# Patient Record
Sex: Male | Born: 1935 | Race: White | Hispanic: No | Smoking: Former smoker
Health system: Southern US, Community
[De-identification: ages and names within clinical notes are randomized; demographics above are authoritative.]

## PROBLEM LIST (undated history)

## (undated) DIAGNOSIS — Z950 Presence of cardiac pacemaker: Secondary | ICD-10-CM

## (undated) DIAGNOSIS — F329 Major depressive disorder, single episode, unspecified: Secondary | ICD-10-CM

## (undated) DIAGNOSIS — R55 Syncope and collapse: Secondary | ICD-10-CM

## (undated) DIAGNOSIS — I472 Ventricular tachycardia: Secondary | ICD-10-CM

## (undated) DIAGNOSIS — E785 Hyperlipidemia, unspecified: Secondary | ICD-10-CM

## (undated) DIAGNOSIS — I2510 Atherosclerotic heart disease of native coronary artery without angina pectoris: Secondary | ICD-10-CM

## (undated) DIAGNOSIS — J449 Chronic obstructive pulmonary disease, unspecified: Secondary | ICD-10-CM

## (undated) DIAGNOSIS — C679 Malignant neoplasm of bladder, unspecified: Secondary | ICD-10-CM

## (undated) DIAGNOSIS — G4700 Insomnia, unspecified: Secondary | ICD-10-CM

## (undated) HISTORY — PX: PACEMAKER INSERTION: SHX728

---

## 2018-03-04 ENCOUNTER — Emergency Department (HOSPITAL_COMMUNITY)
Admission: EM | Admit: 2018-03-04 | Discharge: 2018-03-04 | Disposition: A | Discharge status: Home / Self Care | Payer: Medicare Other | Attending: Emergency Medicine | Admitting: Emergency Medicine

## 2018-03-04 ENCOUNTER — Encounter (HOSPITAL_COMMUNITY): Payer: Self-pay | Admitting: Radiology

## 2018-03-04 ENCOUNTER — Emergency Department (HOSPITAL_COMMUNITY): Payer: Medicare Other

## 2018-03-04 ENCOUNTER — Other Ambulatory Visit: Payer: Self-pay

## 2018-03-04 DIAGNOSIS — J449 Chronic obstructive pulmonary disease, unspecified: Secondary | ICD-10-CM | POA: Diagnosis not present

## 2018-03-04 DIAGNOSIS — R55 Syncope and collapse: Secondary | ICD-10-CM

## 2018-03-04 DIAGNOSIS — S01112A Laceration without foreign body of left eyelid and periocular area, initial encounter: Secondary | ICD-10-CM

## 2018-03-04 DIAGNOSIS — R4182 Altered mental status, unspecified: Secondary | ICD-10-CM | POA: Diagnosis not present

## 2018-03-04 DIAGNOSIS — Z8551 Personal history of malignant neoplasm of bladder: Secondary | ICD-10-CM | POA: Diagnosis not present

## 2018-03-04 DIAGNOSIS — Z23 Encounter for immunization: Secondary | ICD-10-CM | POA: Insufficient documentation

## 2018-03-04 DIAGNOSIS — I472 Ventricular tachycardia, unspecified: Secondary | ICD-10-CM

## 2018-03-04 DIAGNOSIS — Y939 Activity, unspecified: Secondary | ICD-10-CM | POA: Insufficient documentation

## 2018-03-04 DIAGNOSIS — Y92129 Unspecified place in nursing home as the place of occurrence of the external cause: Secondary | ICD-10-CM | POA: Insufficient documentation

## 2018-03-04 DIAGNOSIS — S60222A Contusion of left hand, initial encounter: Secondary | ICD-10-CM | POA: Diagnosis not present

## 2018-03-04 DIAGNOSIS — S0990XA Unspecified injury of head, initial encounter: Secondary | ICD-10-CM

## 2018-03-04 DIAGNOSIS — Z950 Presence of cardiac pacemaker: Secondary | ICD-10-CM | POA: Diagnosis not present

## 2018-03-04 DIAGNOSIS — W19XXXA Unspecified fall, initial encounter: Secondary | ICD-10-CM

## 2018-03-04 DIAGNOSIS — Z79899 Other long term (current) drug therapy: Secondary | ICD-10-CM | POA: Diagnosis not present

## 2018-03-04 DIAGNOSIS — Y998 Other external cause status: Secondary | ICD-10-CM | POA: Insufficient documentation

## 2018-03-04 HISTORY — DX: Syncope and collapse: R55

## 2018-03-04 HISTORY — DX: Presence of cardiac pacemaker: Z95.0

## 2018-03-04 HISTORY — DX: Hyperlipidemia, unspecified: E78.5

## 2018-03-04 HISTORY — DX: Malignant neoplasm of bladder, unspecified: C67.9

## 2018-03-04 HISTORY — DX: Ventricular tachycardia: I47.2

## 2018-03-04 HISTORY — DX: Chronic obstructive pulmonary disease, unspecified: J44.9

## 2018-03-04 HISTORY — DX: Major depressive disorder, single episode, unspecified: F32.9

## 2018-03-04 HISTORY — DX: Ventricular tachycardia, unspecified: I47.20

## 2018-03-04 HISTORY — DX: Atherosclerotic heart disease of native coronary artery without angina pectoris: I25.10

## 2018-03-04 HISTORY — DX: Insomnia, unspecified: G47.00

## 2018-03-04 LAB — COMPREHENSIVE METABOLIC PANEL
ALT: 11 U/L — ABNORMAL LOW (ref 17–63)
AST: 25 U/L (ref 15–41)
Albumin: 3.3 g/dL — ABNORMAL LOW (ref 3.5–5.0)
Alkaline Phosphatase: 127 U/L — ABNORMAL HIGH (ref 38–126)
Anion gap: 7 (ref 5–15)
BUN: 13 mg/dL (ref 6–20)
CHLORIDE: 105 mmol/L (ref 101–111)
CO2: 24 mmol/L (ref 22–32)
CREATININE: 0.9 mg/dL (ref 0.61–1.24)
Calcium: 8.9 mg/dL (ref 8.9–10.3)
Glucose, Bld: 172 mg/dL — ABNORMAL HIGH (ref 65–99)
Potassium: 5.8 mmol/L — ABNORMAL HIGH (ref 3.5–5.1)
SODIUM: 136 mmol/L (ref 135–145)
Total Bilirubin: 0.4 mg/dL (ref 0.3–1.2)
Total Protein: 6.7 g/dL (ref 6.5–8.1)

## 2018-03-04 LAB — I-STAT TROPONIN, ED: Troponin i, poc: 0 ng/mL (ref 0.00–0.08)

## 2018-03-04 LAB — CBC
HCT: 39.8 % (ref 39.0–52.0)
HEMOGLOBIN: 12.5 g/dL — AB (ref 13.0–17.0)
MCH: 29.1 pg (ref 26.0–34.0)
MCHC: 31.4 g/dL (ref 30.0–36.0)
MCV: 92.8 fL (ref 78.0–100.0)
PLATELETS: 273 10*3/uL (ref 150–400)
RBC: 4.29 MIL/uL (ref 4.22–5.81)
RDW: 15.5 % (ref 11.5–15.5)
WBC: 8.3 10*3/uL (ref 4.0–10.5)

## 2018-03-04 LAB — CBG MONITORING, ED: GLUCOSE-CAPILLARY: 156 mg/dL — AB (ref 65–99)

## 2018-03-04 MED ORDER — TETANUS-DIPHTH-ACELL PERTUSSIS 5-2.5-18.5 LF-MCG/0.5 IM SUSP
0.5000 mL | Freq: Once | INTRAMUSCULAR | Status: AC
  Administered 2018-03-04: 0.5 mL via INTRAMUSCULAR
  Filled 2018-03-04: qty 0.5

## 2018-03-04 MED ORDER — LIDOCAINE-EPINEPHRINE (PF) 2 %-1:200000 IJ SOLN
20.0000 mL | Freq: Once | INTRAMUSCULAR | Status: AC
  Administered 2018-03-04: 20 mL
  Filled 2018-03-04: qty 20

## 2018-03-04 MED ORDER — AMIODARONE HCL 200 MG PO TABS: 200.0000 mg | ORAL_TABLET | Freq: Every day | ORAL | 3 refills | Status: DC

## 2018-03-04 NOTE — ED Provider Notes (Signed)
[facility] EMERGENCY DEPARTMENT Provider Note   CSN: [id number] Arrival date & time: 03/04/18  1443     History   Chief Complaint Chief Complaint  Patient presents with  . Fall  . Altered Mental Status    HPI Mark [name] is a 84 y.o. male.  HPI patient presents from nursing facility after fall.  I have no further information on whether the fall was related to syncope or was mechanical.  Patient does not remember the fall.  He has bruising with a laceration over his left eyebrow.  He also complains of left hand pain.  He does have a pacemaker.  He denies chest pain or difficulty breathing. A LEVEL 5 CAVEAT PERTAINS DUE TO ALTERED MENTAL STATUS   Past Medical History:  Diagnosis Date  . 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 Cardiac pacemaker   . COPD (chronic obstructive pulmonary disease) ([place])   . Hyperlipidemia   . Insomnia   . Major depressive disorder   . Malignant neoplasm of bladder (HCC)     There are no active problems to display for this patient.   Past Surgical History:  Procedure Laterality Date  . PACEMAKER INSERTION  02/20108   in [place], [place] Medications    Prior to Admission medications   Medication Sig Start Date End Date Taking? Authorizing Provider  acetaminophen (TYLENOL) 325 MG tablet Take 650 mg by mouth every 6 (six) hours as needed for headache (pain).    Yes [provider]  albuterol (PROVENTIL) (2.5 MG/3ML) 0.083% nebulizer solution Take 2.5 mg by nebulization every 8 (eight) hours as needed for wheezing or shortness of breath.   Yes [provider]  Fluticasone-Salmeterol (ADVAIR) 250-50 MCG/DOSE AEPB Inhale 1 puff into the lungs 2 (two) times daily.    Yes [provider]  Lidocaine HCl (ASPERCREME W/LIDOCAINE) 4 % CREA Apply 1 application topically See admin instructions. Apply to right knee three times daily as needed for pain   Yes  [provider]  methocarbamol (ROBAXIN) 500 MG tablet Take 500 mg by mouth 3 (three) times daily.    Yes [provider]  metoprolol succinate (TOPROL-XL) 50 MG 24 hr tablet Take 50 mg by mouth daily. Take with or immediately following a meal.    Yes [provider]  sertraline (ZOLOFT) 50 MG tablet Take 50 mg by mouth daily.    Yes [provider]  tamsulosin (FLOMAX) 0.4 MG CAPS capsule Take 0.4 mg by mouth at bedtime.    Yes [provider]  traMADol (ULTRAM) 50 MG tablet Take 50 mg by mouth every 6 (six) hours as needed (pain).    Yes [provider]  amiodarone (PACERONE) 200 MG tablet Take 1 tablet (200 mg total) by mouth daily. 2 tabs bid for 2 weeks, then 1 tab bid. 03/04/18   Barrett, Evelene Croon, PA-C    Family History Family History  Problem Relation Age of Onset  . CAD Neg Hx     Social History Social History   Tobacco Use  . Smoking status: Not on file  Substance Use Topics  . Alcohol use: Not on file  . Drug use: Not on file     Allergies   Patient has no known allergies.   Review of Systems Review of Systems  UNABLE TO OBTAIN ROS DUE TO LEVEL 5 CAVEAT   Physical Exam Updated Vital  Signs BP (!) 154/77   Pulse 71   Temp 97.8 F (36.6 C) (Oral)   Resp (!) 21   Ht 5\' 10"  (1.778 m)   Wt 63.5 kg (140 lb)   SpO2 97%   BMI 20.09 kg/m  Vitals reviewed Physical Exam  Physical Examination: General appearance - alert, well appearing, and in no distress Mental status - alert, oriented to person, place, and time Eyes - pupils equal and reactive, extraocular eye movements intact Head- contusion over left brow, 3cm linear laceration of left brow Mouth - mucous membranes moist, pharynx normal without lesions Neck - supple, no significant adenopathy Chest - clear to auscultation, no wheezes, rales or rhonchi, symmetric air entry Heart - normal rate, regular rhythm, normal S1, S2, no murmurs, rubs, clicks or  gallops Abdomen - soft, nontender, nondistended, no masses or organomegaly Back exam -no midline tenderness to palpation Neurological - alert, oriented x 2, normal speech, strength 5/5 in extremities x 4, sensation intact Musculoskeletal - no joint tenderness, deformity or swelling other than ttp over left dorsum of hand Extremities - peripheral pulses normal, no pedal edema, no clubbing or cyanosis Skin - normal coloration and turgor, no rashes, laceration as above   ED Treatments / Results  Labs (all labs ordered are listed, but only abnormal results are displayed) Labs Reviewed  COMPREHENSIVE METABOLIC PANEL - Abnormal; Notable for the following components:      Result Value   Potassium 5.8 (*)    Glucose, Bld 172 (*)    Albumin 3.3 (*)    ALT 11 (*)    Alkaline Phosphatase 127 (*)    All other components within normal limits  CBC - Abnormal; Notable for the following components:   Hemoglobin 12.5 (*)    All other components within normal limits  CBG MONITORING, ED - Abnormal; Notable for the following components:   Glucose-Capillary 156 (*)    All other components within normal limits  I-STAT TROPONIN, ED    EKG  EKG Interpretation  Date/Time:  Tuesday March 04 2018 14:42:13 EDT Ventricular Rate:  75 PR Interval:  158 QRS Duration: 82 QT Interval:  378 QTC Calculation: 422 R Axis:   52 Text Interpretation:  Normal sinus rhythm Nonspecific ST abnormality Abnormal QRS-T angle, consider primary T wave abnormality Abnormal ECG No old tracing to compare Confirmed by Alfonzo Beers [phone] on 03/04/2018 3:08:21 PM       Radiology Ct Head Wo Contrast  Result Date: 03/04/2018 CLINICAL DATA:  Patient fell at nursing home with laceration over the left eye. EXAM: CT HEAD WITHOUT CONTRAST CT CERVICAL SPINE WITHOUT CONTRAST TECHNIQUE: Multidetector CT imaging of the head and cervical spine was performed following the standard protocol without intravenous contrast. Multiplanar CT  image reconstructions of the cervical spine were also generated. COMPARISON:  None. FINDINGS: CT HEAD FINDINGS Brain: Moderate superficial and central atrophy with chronic appearing small vessel ischemia. No acute intracranial hemorrhage, midline shift or edema. No large vascular territory infarct. Midline fourth ventricle and basal cisterns without effacement. Vascular: No hyperdense vessel sign. Moderate atherosclerosis of the cavernous sinus carotids. Skull: No skull fracture. Sinuses/Orbits: Bilateral lens replacements. No acute sinus mastoid disease. Other: Left supraorbital soft tissue swelling with laceration. CT CERVICAL SPINE FINDINGS Alignment: Maintained cervical lordosis. Intact craniocervical relationship and atlantodental interval. Skull base and vertebrae: Osteoarthritis with joint space narrowing across the atlantodental interval. No acute spine nor skull base fracture. Soft tissues and spinal canal: No prevertebral fluid or swelling. No visible  canal hematoma. Disc levels: Moderate disc flattening C5 through C7 with small posterior marginal osteophytes. No significant central or neural foraminal encroachment. Mild multilevel degenerative facet arthropathy more notably at C3-4, C4-5 and C5-6 bilaterally. Upper chest: Negative. Other: Moderate extracranial carotid arteriosclerosis bilaterally. IMPRESSION: 1. Atrophy with chronic moderate small vessel ischemic disease. 2. Left supraorbital soft tissue contusion and laceration without underlying fracture. 3. Cervical spondylosis with degenerative disc disease C5 through C7. No acute cervical spine fracture or listhesis. Electronically Signed   By: Ashley Royalty M.D.   On: 03/04/2018 16:43   Ct Cervical Spine Wo Contrast  Result Date: 03/04/2018 CLINICAL DATA:  Patient fell at nursing home with laceration over the left eye. EXAM: CT HEAD WITHOUT CONTRAST CT CERVICAL SPINE WITHOUT CONTRAST TECHNIQUE: Multidetector CT imaging of the head and cervical  spine was performed following the standard protocol without intravenous contrast. Multiplanar CT image reconstructions of the cervical spine were also generated. COMPARISON:  None. FINDINGS: CT HEAD FINDINGS Brain: Moderate superficial and central atrophy with chronic appearing small vessel ischemia. No acute intracranial hemorrhage, midline shift or edema. No large vascular territory infarct. Midline fourth ventricle and basal cisterns without effacement. Vascular: No hyperdense vessel sign. Moderate atherosclerosis of the cavernous sinus carotids. Skull: No skull fracture. Sinuses/Orbits: Bilateral lens replacements. No acute sinus mastoid disease. Other: Left supraorbital soft tissue swelling with laceration. CT CERVICAL SPINE FINDINGS Alignment: Maintained cervical lordosis. Intact craniocervical relationship and atlantodental interval. Skull base and vertebrae: Osteoarthritis with joint space narrowing across the atlantodental interval. No acute spine nor skull base fracture. Soft tissues and spinal canal: No prevertebral fluid or swelling. No visible canal hematoma. Disc levels: Moderate disc flattening C5 through C7 with small posterior marginal osteophytes. No significant central or neural foraminal encroachment. Mild multilevel degenerative facet arthropathy more notably at C3-4, C4-5 and C5-6 bilaterally. Upper chest: Negative. Other: Moderate extracranial carotid arteriosclerosis bilaterally. IMPRESSION: 1. Atrophy with chronic moderate small vessel ischemic disease. 2. Left supraorbital soft tissue contusion and laceration without underlying fracture. 3. Cervical spondylosis with degenerative disc disease C5 through C7. No acute cervical spine fracture or listhesis. Electronically Signed   By: Ashley Royalty M.D.   On: 03/04/2018 16:43   Dg Hand Complete Left  Result Date: 03/04/2018 CLINICAL DATA:  Fall.  Left hand pain. EXAM: LEFT HAND - COMPLETE 3+ VIEW COMPARISON:  No recent. FINDINGS: Diffuse  osteopenia and degenerative change. Corticated bony densities noted about the radiocarpal joint space consistent with loose bodies. No evidence of fracture or dislocation. IMPRESSION: Diffuse osteopenia degenerative change. Loose bodies noted. No acute abnormality. Electronically Signed   By: Marcello Moores  Register   On: 03/04/2018 15:40    Procedures Procedures (including critical care time)  Medications Ordered in ED Medications  Tdap (BOOSTRIX) injection 0.5 mL (0.5 mLs Intramuscular Given 03/04/18 1603)  lidocaine-EPINEPHrine (XYLOCAINE W/EPI) 2 %-1:200000 (PF) injection 20 mL (20 mLs Infiltration Given 03/04/18 1725)     Initial Impression / Assessment and Plan / ED Course  I have reviewed the triage vital signs and the nursing notes.  Pertinent labs & imaging results that were available during my care of the patient were reviewed by me and considered in my medical decision making (see chart for details).    5:47 PM per pacemaker interogation tech states he had runs of nonsustained vtach today approx 12:30pm- this may have been when fall occurred.  Pt has no memory of fall. D/w Dr. Sallyanne Kuster, cardiology, he will see patient in the ED.  7:03 PM cardiology has seen patient and agrees the vtach on pacer interogation could have resulted in syncope.  However, son has arrived and reports history of metastatic bladder cancer, he is DNR and receiving palliative care only at facility.  Dr. Loletha Grayer is going to start amiodarone to reduce the chances of recurrent significant runs of vtach.  Son is in agreement with plan for discharge.     Final Clinical Impressions(s) / ED Diagnoses   Final diagnoses:  Ventricular tachycardia ([place])  Syncope, unspecified syncope type  Laceration of left eyebrow, initial encounter  Minor head injury, initial encounter  Contusion of left hand, initial encounter    ED Discharge Orders        Ordered    amiodarone (PACERONE) 200 MG tablet  Daily     03/04/18 1911         Pixie Casino, MD 03/04/18 1950

## 2018-03-04 NOTE — ED Notes (Signed)
Patient given discharge instructions and verbalized understanding.  Patient stable to discharge at this time.  Patient is alert and oriented to baseline.  No distressed noted at this time.  All belongings taken with the patient at discharge.   

## 2018-03-04 NOTE — ED Notes (Signed)
Patient transported to X-ray 

## 2018-03-04 NOTE — ED Notes (Signed)
Please note: CBG machine has not been transferring data over consistently. Pt's CBG on arrival was 156.

## 2018-03-04 NOTE — ED Triage Notes (Signed)
Per EMS pt is from a nursing home and had a fall while there and hit the right side of head.  Pt has laceration above left eye.  Pt has pace maker in place.  Disoriented to time oriented to situation and place.  NAD noted at this time
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
## 2018-03-04 NOTE — ED Notes (Addendum)
Pt left for CT.

## 2018-03-04 NOTE — Discharge Instructions (Signed)
Return to the ED with any concerns including difficulty breathing, fainting, vomiting and not able to keep down liquids, decreased level of alertness/lethargy, or any other alarming symptoms

## 2018-03-04 NOTE — ED Provider Notes (Addendum)
LACERATION REPAIR Performed by: Kinnie Feil Authorized by: Kinnie Feil Consent: Verbal consent obtained. Risks and benefits: risks, benefits and alternatives were discussed Consent given by: patient Patient identity confirmed: provided demographic data Prepped and Draped in normal sterile fashion Wound explored  Laceration Location: left eye brow  Laceration Length: 3 cm   No Foreign Bodies seen or palpated  Anesthesia: local infiltration  Local anesthetic: lidocaine with epinephrine  Anesthetic total: 3 ml  Irrigation method: syringe, normal saline, 50 cc Amount of cleaning: standard  Skin closure: primary   Number of sutures: 7  Technique: simple interrupted   Patient tolerance: Patient tolerated the procedure well with no immediate complications.  Antibiotic ointment applied, no dressing.    Kinnie Feil, PA-C 03/04/18 1811    Kinnie Feil, PA-C 03/04/18 1813    Pixie Casino, MD 03/04/18 [phone]

## 2018-03-06 ENCOUNTER — Telehealth: Payer: Self-pay | Admitting: Cardiology

## 2018-03-21 ENCOUNTER — Other Ambulatory Visit (HOSPITAL_COMMUNITY): Payer: Self-pay | Admitting: Internal Medicine

## 2018-03-21 DIAGNOSIS — R1310 Dysphagia, unspecified: Secondary | ICD-10-CM

## 2018-03-28 ENCOUNTER — Ambulatory Visit (HOSPITAL_COMMUNITY)
Admission: RE | Admit: 2018-03-28 | Discharge: 2018-03-28 | Disposition: A | Discharge status: Home / Self Care | Payer: No Typology Code available for payment source | Source: Ambulatory Visit | Attending: Internal Medicine | Admitting: Internal Medicine

## 2018-03-28 DIAGNOSIS — Z950 Presence of cardiac pacemaker: Secondary | ICD-10-CM | POA: Diagnosis not present

## 2018-03-28 DIAGNOSIS — E785 Hyperlipidemia, unspecified: Secondary | ICD-10-CM | POA: Insufficient documentation

## 2018-03-28 DIAGNOSIS — I472 Ventricular tachycardia: Secondary | ICD-10-CM | POA: Diagnosis not present

## 2018-03-28 DIAGNOSIS — J449 Chronic obstructive pulmonary disease, unspecified: Secondary | ICD-10-CM | POA: Insufficient documentation

## 2018-03-28 DIAGNOSIS — F329 Major depressive disorder, single episode, unspecified: Secondary | ICD-10-CM | POA: Insufficient documentation

## 2018-03-28 DIAGNOSIS — G4700 Insomnia, unspecified: Secondary | ICD-10-CM | POA: Insufficient documentation

## 2018-03-28 DIAGNOSIS — I2510 Atherosclerotic heart disease of native coronary artery without angina pectoris: Secondary | ICD-10-CM | POA: Insufficient documentation

## 2018-03-28 DIAGNOSIS — R1312 Dysphagia, oropharyngeal phase: Secondary | ICD-10-CM | POA: Diagnosis not present

## 2018-03-28 DIAGNOSIS — Z8551 Personal history of malignant neoplasm of bladder: Secondary | ICD-10-CM | POA: Insufficient documentation

## 2018-03-28 DIAGNOSIS — R1310 Dysphagia, unspecified: Secondary | ICD-10-CM | POA: Insufficient documentation

## 2018-03-28 NOTE — Progress Notes (Signed)
Modified Barium Swallow Progress Note  Patient Details  Name: Mark [name] MRN: [id number] Date of Birth: 09/25/1932  Today's Date: 03/28/2018  Modified Barium Swallow completed.  Full report located under Chart Review in the Imaging Section.  Brief recommendations include the following:  Clinical Impression  Pt demonstrates mild oral and oropharyngeal deficits. Oral transit of most boluses WFL though there is some mild base of tongue residue that spills posteriorally to valleculae post swallow, though pt clears as needed. Thre is a delay in swallow initiation, not uncommon for age, with thin liquids reaching the pyriforms prior to the swallow. WIth a large, rapid straw sip pt has one instance of trace silent aspiration. Mastication slow but adequate; son reports pt has eaten well since transtioning to a soft diet. Recommend Dys 3 (mech soft) diet with thin liquids, avoiding straws and large sips and taking pills whole in puree (to avoid overlarge sips with pills), though pt to f/u with primary SLP for full recommendations.    Swallow Evaluation Recommendations       SLP Diet Recommendations: Dysphagia 3 (Mech soft) solids;Thin liquid   Liquid Administration via: Cup;No straw   Medication Administration: Whole meds with puree   Supervision: Patient able to self feed   Compensations: Slow rate;Small sips/bites   Postural Changes: Seated upright at 90 degrees;Remain semi-upright after after feeds/meals (Comment)   Oral Care Recommendations: Patient independent with oral care       [facility], MA CCC-SLP [phone]  [name] 03/28/2018,2:05 PM

## 2018-04-11 ENCOUNTER — Encounter: Payer: Self-pay | Admitting: Physician Assistant

## 2018-04-11 ENCOUNTER — Ambulatory Visit (INDEPENDENT_AMBULATORY_CARE_PROVIDER_SITE_OTHER): Payer: Medicare Other | Admitting: Physician Assistant

## 2018-04-11 VITALS — BP 98/50 | HR 70 | Ht 70.0 in | Wt 139.2 lb

## 2018-04-11 DIAGNOSIS — I472 Ventricular tachycardia, unspecified: Secondary | ICD-10-CM

## 2018-04-11 DIAGNOSIS — R55 Syncope and collapse: Secondary | ICD-10-CM | POA: Diagnosis not present

## 2018-04-11 MED ORDER — AMIODARONE HCL 200 MG PO TABS: 200.0000 mg | ORAL_TABLET | Freq: Every day | ORAL | 1 refills | Status: AC

## 2018-04-11 MED ORDER — METOPROLOL SUCCINATE ER 25 MG PO TB24: 25.0000 mg | ORAL_TABLET | Freq: Every day | ORAL | 1 refills | Status: AC

## 2018-04-11 NOTE — Progress Notes (Signed)
Cardiology Office Note   Date:  04/11/2018   ID:  Mark [name], DOB May 10, 1932, MRN [id number]  PCP:  Wenda Low, MD  Cardiologist: Dr. Sallyanne Kuster; Dr Wallace Cullens, [facility] in [place], Dr Katheren Puller, [place] Rosaria Ferries, PA-C    History of Present Illness: Mark [name] is a 84 y.o. male with a history of BSCi PPM inserted after a syncopal episode 01/2017 in [place], COPD, HLD, depression, memory issues, bladder CA with expanding pulmonary mets  ER visit 03/04/2018 for syncope, device interrogation showed multiple episodes of VT, not a candidate for advanced therapies, amiodarone as palliative therapy started at 400 mg twice daily for 2 weeks then 200 mg bid.  Early follow-up recommended to decide about further evaluation, possibly an echo.  Set up for remote pacemaker transmissions, CODE STATUS was addressed and DNR status reaffirmed  Mark [name] presents for cardiology follow up.  He is here today with his son.  His son spends time with him on a daily basis,   He is now in [place], tolerating the transition well. He is walking with a walker but has not been out of the room much.   He has not complained of chest pain or SOB. He has limited ability to exert himself, gets SOB w/ exertion. No change since his hospitalization.   His exertion level is very limited, but he has more energy now than he did have. Seems to be doing better.   He has not complained of LE edema, feels he is getting some rest.   He has not complained of any palpitations and his ICD has not fired.  He has had no more syncopal episodes.    Past Medical History:  Diagnosis Date  . 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 Cardiac pacemaker   . COPD (chronic obstructive pulmonary disease) ([place])   . Hyperlipidemia   . Insomnia   . Major depressive disorder   . Malignant neoplasm of bladder ([place])   . Syncope 03/04/2018  . VT (ventricular tachycardia)  ([place]) 03/04/2018    Past Surgical History:  Procedure Laterality Date  . PACEMAKER INSERTION  02/20108   in [place], [place] [employer]    Current Outpatient Medications  Medication Sig Dispense Refill  . acetaminophen (TYLENOL) 325 MG tablet Take 650 mg by mouth every 6 (six) hours as needed for headache (pain).     Marland Kitchen albuterol (PROVENTIL) (2.5 MG/3ML) 0.083% nebulizer solution Take 2.5 mg by nebulization every 8 (eight) hours as needed for wheezing or shortness of breath.    Marland Kitchen amiodarone (PACERONE) 200 MG tablet Take 1 tablet (200 mg total) by mouth daily. 2 tabs bid for 2 weeks, then 1 tab bid. 120 tablet 3  . Cholecalciferol (VITAMIN D3) 1000 units CAPS Take 2,000 Units by mouth 2 (two) times daily.    . Fluticasone-Salmeterol (ADVAIR) 250-50 MCG/DOSE AEPB Inhale 1 puff into the lungs 2 (two) times daily.     . Lidocaine HCl (ASPERCREME W/LIDOCAINE) 4 % CREA Apply 1 application topically See admin instructions. Apply to right knee three times daily as needed for pain    . methocarbamol (ROBAXIN) 500 MG tablet Take 500 mg by mouth 3 (three) times daily.     . metoprolol succinate (TOPROL-XL) 50 MG 24 hr tablet Take 50 mg by mouth daily. Take with or immediately following a meal.     . sertraline (ZOLOFT) 50 MG tablet Take 50 mg  by mouth daily.     . tamsulosin (FLOMAX) 0.4 MG CAPS capsule Take 0.4 mg by mouth at bedtime.     . traMADol (ULTRAM) 50 MG tablet Take 50 mg by mouth every 6 (six) hours as needed (pain).      No current facility-administered medications for this visit.     Allergies:   Patient has no known allergies.    Social History:  The patient  reports that he quit smoking about 16 months ago. His smoking use included cigarettes. He has never used smokeless tobacco.   Family History:  The patient's family history is not on file.    ROS:  Please see the history of present illness. All other systems are reviewed and negative.    PHYSICAL EXAM: VS:  BP (!) 98/50  (BP Location: Right Arm, Patient Position: Sitting, Cuff Size: Normal)   Pulse 70   Ht 5\' 10"  (1.778 m)   Wt 139 lb 3.2 oz (63.1 kg)   SpO2 94% Comment: on 2L  BMI 19.97 kg/m  , BMI Body mass index is 19.97 kg/m. GEN: Well nourished, well developed, male in no acute distress  HEENT: normal for age  Neck: no JVD, no carotid bruit, no masses Cardiac: RRR; 2/6 murmur, no rubs, or gallops Respiratory:  clear to auscultation bilaterally, normal work of breathing GI: soft, nontender, nondistended, + BS MS: no deformity or atrophy; no edema; distal pulses are 2+ in all 4 extremities   Skin: warm and dry, no rash Neuro:  Strength and sensation are intact Psych: euthymic mood, full affect   EKG:  EKG is not ordered today.   Recent Labs: 03/04/2018: ALT 11; BUN 13; Creatinine, Ser 0.90; Hemoglobin 12.5; Platelets 273; Potassium 5.8; Sodium 136    Lipid Panel No results found for: CHOL, TRIG, HDL, CHOLHDL, VLDL, LDLCALC, LDLDIRECT   Wt Readings from Last 3 Encounters:  04/11/18 139 lb 3.2 oz (63.1 kg)  03/04/18 140 lb (63.5 kg)     Other studies Reviewed: Additional studies/ records that were reviewed today include: Hospital records and testing.  ASSESSMENT AND PLAN:  1.  Syncope: No additional episodes, he is still very weak but is getting better.  Continue activity as tolerated.  2.  VT: His device was interrogated and he has had no episodes of VT since the ones in March.  -He is A pacing 42% of the time and rarely V pacing - his device has acceptable parameters. Chelley Truitt is making arrangements to get the device downloads transferred from the physicians at [place] to here.  - Continue amiodarone but will decrease the dose to 200 mg daily.  2.  Hypotension: His blood pressure is a little low and I am concerned that it can lead to an increased risk of falls.  Decrease the metoprolol to 25 mg daily.   Current medicines are reviewed at length with the patient today.   The patient does not have concerns regarding medicines.  The following changes have been made: Decrease metoprolol, decrease amiodarone  Labs/ tests ordered today include:  No orders of the defined types were placed in this encounter.    Disposition:   FU with Dr. Sallyanne Kuster  Signed, Rosaria Ferries, PA-C  04/11/2018 2:32 PM    [place] Phone: [phone]; Fax: [phone]  This note was written with the assistance of speech recognition software. Please excuse any transcriptional errors.

## 2018-04-11 NOTE — Patient Instructions (Signed)
Medication Instructions:  Decrease: Metoprolol 25 mg once a day Take Amiodarone 200 mg once a day     Follow-Up: Your physician recommends that you schedule a follow-up appointment in: 3 months with Dr. Sallyanne Kuster   Any Other Special Instructions Will Be Listed Below (If Applicable).     If you need a refill on your cardiac medications before your next appointment, please call your pharmacy.

## 2018-07-18 ENCOUNTER — Ambulatory Visit: Payer: Medicare Other | Admitting: Cardiovascular Disease

## 2018-07-29 ENCOUNTER — Other Ambulatory Visit: Payer: Self-pay

## 2018-07-29 ENCOUNTER — Inpatient Hospital Stay (HOSPITAL_COMMUNITY)
Admission: EM | Admit: 2018-07-29 | Discharge: 2018-07-31 | DRG: 193 | Disposition: A | Discharge status: Home Health | Payer: Medicare Other | Source: Skilled Nursing Facility | Attending: Family Medicine | Admitting: Family Medicine

## 2018-07-29 ENCOUNTER — Emergency Department (HOSPITAL_COMMUNITY): Payer: Medicare Other

## 2018-07-29 ENCOUNTER — Encounter (HOSPITAL_COMMUNITY): Payer: Self-pay | Admitting: *Deleted

## 2018-07-29 DIAGNOSIS — Z87891 Personal history of nicotine dependence: Secondary | ICD-10-CM | POA: Diagnosis not present

## 2018-07-29 DIAGNOSIS — N400 Enlarged prostate without lower urinary tract symptoms: Secondary | ICD-10-CM | POA: Diagnosis present

## 2018-07-29 DIAGNOSIS — Z9981 Dependence on supplemental oxygen: Secondary | ICD-10-CM | POA: Diagnosis not present

## 2018-07-29 DIAGNOSIS — J918 Pleural effusion in other conditions classified elsewhere: Secondary | ICD-10-CM | POA: Diagnosis present

## 2018-07-29 DIAGNOSIS — E875 Hyperkalemia: Secondary | ICD-10-CM | POA: Diagnosis not present

## 2018-07-29 DIAGNOSIS — Z8551 Personal history of malignant neoplasm of bladder: Secondary | ICD-10-CM | POA: Diagnosis not present

## 2018-07-29 DIAGNOSIS — J90 Pleural effusion, not elsewhere classified: Secondary | ICD-10-CM

## 2018-07-29 DIAGNOSIS — Z950 Presence of cardiac pacemaker: Secondary | ICD-10-CM

## 2018-07-29 DIAGNOSIS — J189 Pneumonia, unspecified organism: Principal | ICD-10-CM | POA: Diagnosis present

## 2018-07-29 DIAGNOSIS — J9621 Acute and chronic respiratory failure with hypoxia: Secondary | ICD-10-CM | POA: Diagnosis present

## 2018-07-29 DIAGNOSIS — Z9221 Personal history of antineoplastic chemotherapy: Secondary | ICD-10-CM | POA: Diagnosis not present

## 2018-07-29 DIAGNOSIS — Z66 Do not resuscitate: Secondary | ICD-10-CM | POA: Diagnosis present

## 2018-07-29 DIAGNOSIS — I2510 Atherosclerotic heart disease of native coronary artery without angina pectoris: Secondary | ICD-10-CM | POA: Diagnosis present

## 2018-07-29 DIAGNOSIS — F419 Anxiety disorder, unspecified: Secondary | ICD-10-CM | POA: Diagnosis present

## 2018-07-29 DIAGNOSIS — J440 Chronic obstructive pulmonary disease with acute lower respiratory infection: Secondary | ICD-10-CM | POA: Diagnosis present

## 2018-07-29 DIAGNOSIS — D649 Anemia, unspecified: Secondary | ICD-10-CM | POA: Diagnosis present

## 2018-07-29 DIAGNOSIS — E785 Hyperlipidemia, unspecified: Secondary | ICD-10-CM | POA: Diagnosis present

## 2018-07-29 DIAGNOSIS — I4891 Unspecified atrial fibrillation: Secondary | ICD-10-CM | POA: Diagnosis present

## 2018-07-29 DIAGNOSIS — Z9889 Other specified postprocedural states: Secondary | ICD-10-CM

## 2018-07-29 DIAGNOSIS — C7800 Secondary malignant neoplasm of unspecified lung: Secondary | ICD-10-CM | POA: Diagnosis present

## 2018-07-29 DIAGNOSIS — J449 Chronic obstructive pulmonary disease, unspecified: Secondary | ICD-10-CM | POA: Diagnosis not present

## 2018-07-29 DIAGNOSIS — F329 Major depressive disorder, single episode, unspecified: Secondary | ICD-10-CM | POA: Diagnosis present

## 2018-07-29 DIAGNOSIS — Z923 Personal history of irradiation: Secondary | ICD-10-CM | POA: Diagnosis not present

## 2018-07-29 DIAGNOSIS — Z7951 Long term (current) use of inhaled steroids: Secondary | ICD-10-CM

## 2018-07-29 DIAGNOSIS — Z515 Encounter for palliative care: Secondary | ICD-10-CM | POA: Diagnosis not present

## 2018-07-29 LAB — BASIC METABOLIC PANEL
Anion gap: 7 (ref 5–15)
BUN: 17 mg/dL (ref 8–23)
CO2: 32 mmol/L (ref 22–32)
Calcium: 8.4 mg/dL — ABNORMAL LOW (ref 8.9–10.3)
Chloride: 103 mmol/L (ref 98–111)
Creatinine, Ser: 0.83 mg/dL (ref 0.61–1.24)
GFR calc Af Amer: >60 mL/min (ref >60)
GFR calc non Af Amer: >60 mL/min (ref >60)
Glucose, Bld: 115 mg/dL — ABNORMAL HIGH (ref 70–99)
Potassium: 4.6 mmol/L (ref 3.5–5.1)
Sodium: 142 mmol/L (ref 135–145)

## 2018-07-29 LAB — CBC WITH DIFFERENTIAL/PLATELET
Basophils Absolute: 0 10*3/uL (ref 0.0–0.1)
Basophils Relative: 0 %
Eosinophils Absolute: 0 10*3/uL (ref 0.0–0.7)
Eosinophils Relative: 0 %
HCT: 36.3 % — ABNORMAL LOW (ref 39.0–52.0)
Hemoglobin: 11.1 g/dL — ABNORMAL LOW (ref 13.0–17.0)
Lymphocytes Relative: 6 %
Lymphs Abs: 0.8 10*3/uL (ref 0.7–4.0)
MCH: 29.6 pg (ref 26.0–34.0)
MCHC: 30.6 g/dL (ref 30.0–36.0)
MCV: 96.8 fL (ref 78.0–100.0)
Monocytes Absolute: 1 10*3/uL (ref 0.1–1.0)
Monocytes Relative: 8 %
Neutro Abs: 10.6 10*3/uL — ABNORMAL HIGH (ref 1.7–7.7)
Neutrophils Relative %: 86 %
Platelets: 276 10*3/uL (ref 150–400)
RBC: 3.75 MIL/uL — ABNORMAL LOW (ref 4.22–5.81)
RDW: 15.2 % (ref 11.5–15.5)
WBC: 12.4 10*3/uL — ABNORMAL HIGH (ref 4.0–10.5)

## 2018-07-29 LAB — BRAIN NATRIURETIC PEPTIDE: B NATRIURETIC PEPTIDE 5: 162.1 pg/mL — AB (ref 0.0–100.0)

## 2018-07-29 MED ORDER — AZITHROMYCIN 250 MG PO TABS
500.0000 mg | ORAL_TABLET | ORAL | Status: AC
  Administered 2018-07-29 – 2018-07-31 (×3): 500 mg via ORAL
  Filled 2018-07-29 (×3): qty 2

## 2018-07-29 MED ORDER — SERTRALINE HCL 50 MG PO TABS
50.0000 mg | ORAL_TABLET | Freq: Every day | ORAL | Status: DC
  Administered 2018-07-30 – 2018-07-31 (×2): 50 mg via ORAL
  Filled 2018-07-29 (×3): qty 1

## 2018-07-29 MED ORDER — TRAMADOL HCL 50 MG PO TABS
50.0000 mg | ORAL_TABLET | Freq: Four times a day (QID) | ORAL | Status: DC | PRN
  Administered 2018-07-31: 50 mg via ORAL
  Filled 2018-07-29: qty 1

## 2018-07-29 MED ORDER — ALPRAZOLAM 0.25 MG PO TABS
0.2500 mg | ORAL_TABLET | Freq: Four times a day (QID) | ORAL | Status: DC | PRN
Start: 2018-07-29 — End: 2018-07-31

## 2018-07-29 MED ORDER — ALBUTEROL SULFATE (2.5 MG/3ML) 0.083% IN NEBU: 2.5000 mg | INHALATION_SOLUTION | Freq: Three times a day (TID) | RESPIRATORY_TRACT | Status: DC | PRN

## 2018-07-29 MED ORDER — SODIUM CHLORIDE 0.9 % IV SOLN
1.0000 g | INTRAVENOUS | Status: AC
  Administered 2018-07-29 – 2018-07-31 (×3): 1 g via INTRAVENOUS
  Filled 2018-07-29 (×3): qty 1

## 2018-07-29 MED ORDER — AMIODARONE HCL 200 MG PO TABS
200.0000 mg | ORAL_TABLET | Freq: Every day | ORAL | Status: DC
  Administered 2018-07-30 – 2018-07-31 (×2): 200 mg via ORAL
  Filled 2018-07-29 (×2): qty 1

## 2018-07-29 MED ORDER — IOHEXOL 300 MG/ML  SOLN
75.0000 mL | Freq: Once | INTRAMUSCULAR | Status: AC | PRN
  Administered 2018-07-29: 75 mL via INTRAVENOUS

## 2018-07-29 MED ORDER — METOPROLOL SUCCINATE ER 25 MG PO TB24
25.0000 mg | ORAL_TABLET | Freq: Every day | ORAL | Status: DC
  Administered 2018-07-30 – 2018-07-31 (×2): 25 mg via ORAL
  Filled 2018-07-29 (×2): qty 1

## 2018-07-29 MED ORDER — VITAMIN D3 25 MCG (1000 UNIT) PO TABS
2000.0000 [IU] | ORAL_TABLET | Freq: Every day | ORAL | Status: DC
  Administered 2018-07-30 – 2018-07-31 (×2): 2000 [IU] via ORAL
  Filled 2018-07-29 (×2): qty 2

## 2018-07-29 MED ORDER — TAMSULOSIN HCL 0.4 MG PO CAPS
0.4000 mg | ORAL_CAPSULE | Freq: Every day | ORAL | Status: DC
  Administered 2018-07-29 – 2018-07-30 (×2): 0.4 mg via ORAL
  Filled 2018-07-29 (×2): qty 1

## 2018-07-29 MED ORDER — MOMETASONE FURO-FORMOTEROL FUM 200-5 MCG/ACT IN AERO
2.0000 | INHALATION_SPRAY | Freq: Two times a day (BID) | RESPIRATORY_TRACT | Status: DC
  Administered 2018-07-29 – 2018-07-31 (×4): 2 via RESPIRATORY_TRACT
  Filled 2018-07-29: qty 8.8

## 2018-07-29 MED ORDER — LIDOCAINE HCL 4 % EX CREA: 1.0000 "application " | TOPICAL_CREAM | CUTANEOUS | Status: DC

## 2018-07-29 MED ORDER — ALBUTEROL SULFATE (2.5 MG/3ML) 0.083% IN NEBU
5.0000 mg | INHALATION_SOLUTION | Freq: Once | RESPIRATORY_TRACT | Status: AC
  Administered 2018-07-29: 5 mg via RESPIRATORY_TRACT
  Filled 2018-07-29: qty 6

## 2018-07-29 MED ORDER — LIDOCAINE 4 % EX CREA
TOPICAL_CREAM | Freq: Three times a day (TID) | CUTANEOUS | Status: DC | PRN
  Filled 2018-07-29: qty 5

## 2018-07-29 MED ORDER — SODIUM CHLORIDE 0.9 % IV SOLN: INTRAVENOUS | Status: DC | PRN

## 2018-07-29 MED ORDER — MELATONIN 3 MG PO TABS
3.0000 mg | ORAL_TABLET | Freq: Every day | ORAL | Status: DC
  Administered 2018-07-29 – 2018-07-30 (×2): 3 mg via ORAL
  Filled 2018-07-29 (×3): qty 1

## 2018-07-29 MED ORDER — ALBUTEROL SULFATE (2.5 MG/3ML) 0.083% IN NEBU
2.5000 mg | INHALATION_SOLUTION | Freq: Four times a day (QID) | RESPIRATORY_TRACT | Status: DC
  Administered 2018-07-30 – 2018-07-31 (×4): 2.5 mg via RESPIRATORY_TRACT
  Filled 2018-07-29 (×5): qty 3

## 2018-07-29 MED ORDER — ACETAMINOPHEN 325 MG PO TABS: 650.0000 mg | ORAL_TABLET | Freq: Four times a day (QID) | ORAL | Status: DC | PRN

## 2018-07-29 MED ORDER — VITAMIN D3 25 MCG (1000 UT) PO CAPS: 2000.0000 [IU] | ORAL_CAPSULE | Freq: Every day | ORAL | Status: DC

## 2018-07-29 MED ORDER — ACETAMINOPHEN 650 MG RE SUPP: 650.0000 mg | Freq: Four times a day (QID) | RECTAL | Status: DC | PRN

## 2018-07-29 NOTE — H&P (Addendum)
History and Physical    Mark [name] [id number]:[id number] DOB: 01/26/1932 DOA: 07/29/2018  PCP: Wenda Low, MD  Patient coming from: SNF  I have personally briefly reviewed patient's old medical records in [place]  Chief Complaint: SOB  HPI: Mark [name] is Mark [name] 84 y.o. male with medical history significant of COPD, depression, coronary artery disease, status post pacemaker placement, hyperlipidemia, bladder cancer presenting with increased shortness of breath and oxygen requirement with chest x-ray findings at SNF concerning for pneumonia with Mark [name] pleural effusion.  He notes paroxysmal nocturnal dyspnea and orthopnea.  On discussion with the skilled nursing facility, they say that he has been on 2 L by nasal cannula for Mark [name] little while, but recently his oxygen requirement has increased.  He denies any fevers or chills.  He denies any abdominal pain or nausea and vomiting.  He denies any swelling in his legs.  He does have Mark [name] history of smoking.  ED Course: Labs, EKG, CXR, chest CT.  Admit for thoracentesis.  Review of Systems: As per HPI otherwise 10 point review of systems negative.   Past Medical History:  Diagnosis Date  . 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 Cardiac pacemaker   . COPD (chronic obstructive pulmonary disease) ([place])   . Hyperlipidemia   . Insomnia   . Major depressive disorder   . Malignant neoplasm of bladder ([place])   . Syncope 03/04/2018  . VT (ventricular tachycardia) ([place]) 03/04/2018    Past Surgical History:  Procedure Laterality Date  . PACEMAKER INSERTION  02/20108   in [place]     reports that he quit smoking about 20 months ago. His smoking use included cigarettes. He has never used smokeless tobacco. He reports that he does not drink alcohol or use drugs.  No Known Allergies  Family History  Problem Relation Age of Onset  . CAD Neg Hx    Prior to Admission medications   Medication Sig Start Date End  Date Taking? Authorizing Provider  acetaminophen (TYLENOL) 325 MG tablet Take 650 mg by mouth every 6 (six) hours as needed for mild pain.    Yes [provider]  albuterol (PROVENTIL) (2.5 MG/3ML) 0.083% nebulizer solution Take 2.5 mg by nebulization 3 (three) times daily as needed for wheezing or shortness of breath.    Yes [provider]  ALPRAZolam (XANAX) 0.25 MG tablet Take 0.25 mg by mouth every 6 (six) hours as needed for anxiety. 07/28/18  Yes [provider]  amiodarone (PACERONE) 200 MG tablet Take 1 tablet (200 mg total) by mouth daily. 04/11/18  Yes Barrett, Evelene Croon, PA-C  amoxicillin-clavulanate (AUGMENTIN) 875-125 MG tablet Take 1 tablet by mouth 2 (two) times daily. 07/29/18 08/07/18 Yes [provider]  Cholecalciferol (VITAMIN D3) 1000 units CAPS Take 2,000 Units by mouth 2 (two) times daily.   Yes [provider]  Fluticasone-Salmeterol (ADVAIR) 250-50 MCG/DOSE AEPB Inhale 1 puff into the lungs 2 (two) times daily.    Yes [provider]  Lidocaine HCl (ASPERCREME W/LIDOCAINE) 4 % CREA Apply 1 application topically See admin instructions. Apply to right knee three times daily as needed for pain   Yes [provider]  Melatonin 3 MG TABS Take 3 mg by mouth at bedtime. 07/28/18  Yes [provider]  metoprolol succinate (TOPROL-XL) 25 MG 24 hr tablet Take 1 tablet (25 mg total) by mouth daily. Take with or immediately following Mark [name] meal. 04/11/18  Yes  Barrett, Evelene Croon, PA-C  sertraline (ZOLOFT) 50 MG tablet Take 50 mg by mouth daily.    Yes [provider]  tamsulosin (FLOMAX) 0.4 MG CAPS capsule Take 0.4 mg by mouth at bedtime.    Yes [provider]  traMADol (ULTRAM) 50 MG tablet Take 50 mg by mouth every 6 (six) hours as needed (pain).    Yes [provider]    Physical Exam: Vitals:   07/29/18 1201 07/29/18 1506 07/29/18 1702 07/29/18 1717  BP:  (!) 154/59  (!) 147/65  Pulse:  77  70    Resp:  (!) 24  18  Temp:    98.2 F (36.8 C)  TempSrc:    Oral  SpO2: 99% 100%  99%  Weight:   65.5 kg (144 lb 6.4 oz)   Height:   5\' 10"  (1.778 m)     Constitutional: NAD, calm, comfortable Vitals:   07/29/18 1201 07/29/18 1506 07/29/18 1702 07/29/18 1717  BP:  (!) 154/59  (!) 147/65  Pulse:  77  70  Resp:  (!) 24  18  Temp:    98.2 F (36.8 C)  TempSrc:    Oral  SpO2: 99% 100%  99%  Weight:   65.5 kg (144 lb 6.4 oz)   Height:   5\' 10"  (1.778 m)    Eyes: PERRL, lids and conjunctivae normal ENMT: Mucous membranes are moist. Posterior pharynx clear of any exudate or lesions.Normal dentition.  Neck: normal, supple, no masses, no thyromegaly Respiratory: Decreased breath sounds in L lung field.  On 4 L Mark [name], increased WOB.   Cardiovascular: Regular rate and rhythm, no murmurs / rubs / gallops. No extremity edema.  Abdomen: no tenderness, no masses palpated. No hepatosplenomegaly. Bowel sounds positive.  Musculoskeletal: no clubbing / cyanosis. No joint deformity upper and lower extremities. Good ROM, no contractures. Normal muscle tone.  Skin: no rashes, lesions, ulcers. No induration Neurologic: CN 2-12 grossly intact. Sensation intact.  Moving all extremities equally.  Psychiatric: Normal judgment and insight. Alert and oriented x 3. Normal mood.   Labs on Admission: I have personally reviewed following labs and imaging studies  CBC: Recent Labs  Lab 07/29/18 1326  WBC 12.4*  NEUTROABS 10.6*  HGB 11.1*  HCT 36.3*  MCV 96.8  PLT 174   Basic Metabolic Panel: Recent Labs  Lab 07/29/18 1326  NA 142  K 4.6  CL 103  CO2 32  GLUCOSE 115*  BUN 17  CREATININE 0.83  CALCIUM 8.4*   GFR: Estimated Creatinine Clearance: 62.5 mL/min (by C-G formula based on SCr of 0.83 mg/dL). Liver Function Tests: No results for input(s): AST, ALT, ALKPHOS, BILITOT, PROT, ALBUMIN in the last 168 hours. No results for input(s): LIPASE, AMYLASE in the last 168 hours. No results for  input(s): AMMONIA in the last 168 hours. Coagulation Profile: No results for input(s): INR, PROTIME in the last 168 hours. Cardiac Enzymes: No results for input(s): CKTOTAL, CKMB, CKMBINDEX, TROPONINI in the last 168 hours. BNP (last 3 results) No results for input(s): PROBNP in the last 8760 hours. HbA1C: No results for input(s): HGBA1C in the last 72 hours. CBG: No results for input(s): GLUCAP in the last 168 hours. Lipid Profile: No results for input(s): CHOL, HDL, LDLCALC, TRIG, CHOLHDL, LDLDIRECT in the last 72 hours. Thyroid Function Tests: No results for input(s): TSH, T4TOTAL, FREET4, T3FREE, THYROIDAB in the last 72 hours. Anemia Panel: No results for input(s): VITAMINB12, FOLATE, FERRITIN, TIBC, IRON, RETICCTPCT in the last  72 hours. Urine analysis: No results found for: COLORURINE, APPEARANCEUR, LABSPEC, PHURINE, GLUCOSEU, HGBUR, BILIRUBINUR, KETONESUR, PROTEINUR, UROBILINOGEN, NITRITE, LEUKOCYTESUR  Radiological Exams on Admission: Dg Chest 2 View  Result Date: 07/29/2018 CLINICAL DATA:  Family states he was brought in to have "fluid drained from his lung" and had sob increasing since yesterday. EXAM: CHEST - 2 VIEW COMPARISON:  None. FINDINGS: Patient has Kilan Banfill LEFT-sided transvenous pacemaker with leads to the RIGHT atrium and RIGHT ventricle. The heart margins are obscured by significant LEFT lung opacity. There is atherosclerotic calcification of the thoracic aorta. Opacity spares only the apex and is consistent with pleural effusion and atelectasis or infiltrate. The RIGHT lung is clear. Previous vertebral augmentation. IMPRESSION: Significant opacity in the LEFT hemithorax, consistent with pleural effusion and atelectasis or infiltrate. Aortic atherosclerosis.  (ICD10-I70.0) Electronically Signed   By: Nolon Nations M.D.   On: 07/29/2018 13:08   Ct Chest W Contrast  Result Date: 07/29/2018 CLINICAL DATA:  Shortness of breath. EXAM: CT CHEST WITH CONTRAST TECHNIQUE:  Multidetector CT imaging of the chest was performed during intravenous contrast administration. CONTRAST:  8mL OMNIPAQUE IOHEXOL 300 MG/ML  SOLN COMPARISON:  Radiographs of July 29, 2018. FINDINGS: Cardiovascular: Atherosclerosis of thoracic aorta is noted without aneurysm formation. Coronary artery calcifications are noted. Normal cardiac size. No pericardial effusion. Mediastinum/Nodes: Thyroid gland and esophagus are unremarkable. No significant adenopathy is noted. Lungs/Pleura: 10.8 x 10.3 x 5.0 cm probable mass is seen medially in left upper lobe which is resulting in atelectasis of left upper lobe. Several smaller masses are noted in the collapsed left lung concerning for metastatic disease. The largest measures 4.1 x 3.1 cm. Soft tissue material is noted in the left mainstem bronchus and its branches consistent with aspirated material or mucous plugging. Large left pleural effusion is noted with atelectasis of the left lower lobe and significant atelectasis of left upper lobe as previously described. Multiple rounded densities are noted in the right lung consistent with metastatic disease. The largest measures 2.4 cm in right middle lobe. Some degree of emphysematous disease is noted in the right upper lobe. Upper Abdomen: No acute abnormality. Musculoskeletal: No chest wall abnormality. No acute or significant osseous findings. IMPRESSION: Large left pleural effusion is noted with complete atelectasis of the left lower lobe and significant atelectasis of the left upper lobe. 10.8 x 10.3 x 5 cm mass is noted medially in the left upper lobe consistent with malignancy. Probable mucus plugging or aspirated material is noted in the left mainstem bronchus. Multiple pulmonary masses are noted in both lungs consistent with metastatic disease. Further evaluation with PET scan and bronchoscopy is recommended. Coronary artery calcifications are noted suggesting coronary artery disease. Aortic Atherosclerosis  (ICD10-I70.0) and Emphysema (ICD10-J43.9). Electronically Signed   By: Marijo Conception, M.D.   On: 07/29/2018 16:09    EKG: Independently reviewed. Atrial paced.  Compared to priors, appears similar to priors.  Assessment/Plan Active Problems:   CAP (community acquired pneumonia)  Community Acquired Pneumonia  Left Pleural Effusion  Acute Hypoxic Resp Failure:  Pt typically on 2 L [place], this has been increased recently.  Found to have pneumonia and started on augmentin as outpatient, but sent here with increasing SOB and concern for need for thoracentesis.   Follow blood cx, sputum cx, urine strep Ceftriaxone, azithro IR thoracentesis Follow CT chest -> with LUL mass consistent with malignancy and multiple pulmonary masses in both lungs concerning for metastatic disease.  As well as mucus plugging vs aspirated marterial  in L mainstem bronchus (see report).  Will need to consult oncology.   Speech evaluation   Addendum: discussed with son who was able to give me additional hx.  He has hx of bladder cancer s/p surgery and radiation therapy.  He developed multiple spots on his lung concerning for malignancy.  His son notes their goal is comfort and not desiring any additional treatment of malignancy.  Desires thoracentesis for comfort.  Will c/s palliative care as well.  Leukocytosis: 2/2 above  Hx CAD  Pacemaker Placement  Hx Vtach:  Amiodarone, metoprolol  Anemia: follow  Orthopnea  PND: suspect 2/2 above, follow BNP.  Consider echo.  COPD: continue home inhalers  Depression: continue zoloft  BPH: continue flomax  Anxiety: xanax prn  DVT prophylaxis: SCD Code Status: DNR  Family Communication: called son Disposition Plan: pending Consults called: IR  Admission status: inpatient    Fayrene Helper MD [facility] Pager [phone]  If 7PM-7AM, please contact night-coverage [email or web address] Password [email or web address]  07/29/2018, 6:02 PM

## 2018-07-29 NOTE — ED Notes (Signed)
Bed: PC34 Expected date: 07/29/18 Expected time: 10:53 AM Means of arrival: Ambulance Comments: Wheezing 84 yo

## 2018-07-29 NOTE — ED Triage Notes (Signed)
EMS reports from [facility] for SOB, initially staff states Sp02 sats were 82, on EMS arrival sats at 97% on 4ltrs. Hx of COPD and hypertension. Pacemaker  BP 114/73 HR 70 Resps 20 Sp02 97 on 4ltrs CBG 160  Given 5mg  Albuterol enroute

## 2018-07-29 NOTE — ED Notes (Addendum)
During triage pt sating in low 80s increased 02 to 6 l with no improvement. Changed to NRB for a couple of min. Placed on 4l [place] and able to maintain. Resp notified for breathing treatment

## 2018-07-29 NOTE — ED Notes (Signed)
ED TO INPATIENT HANDOFF REPORT  Name/Age/Gender Mark [name] 84 y.o. male  Code Status Advance Directive Documentation     Most Recent Value  Type of Advance Directive  Out of facility DNR (pink MOST or yellow form)  Pre-existing out of facility DNR order (yellow form or pink MOST form)  Yellow form placed in chart (order not valid for inpatient use)  "MOST" Form in Place?  -      Home/SNF/Other Home  Chief Complaint SOB  Level of Care/Admitting Diagnosis ED Disposition    ED Disposition Condition [place]: [place] [100102]  Level of Care: Telemetry [5]  Admit to tele based on following criteria: Other see comments  Comments: pneumonia  Diagnosis: CAP (community acquired pneumonia) [[id number]]  Admitting Physician: Elodia Florence [phone]  Attending Physician: Cephus Slater, A CALDWELL [phone]  PT Class (Do Not Modify): Observation [104]  PT Acc Code (Do Not Modify): Observation [10022]       Medical History Past Medical History:  Diagnosis Date  . 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 Cardiac pacemaker   . COPD (chronic obstructive pulmonary disease) ([place])   . Hyperlipidemia   . Insomnia   . Major depressive disorder   . Malignant neoplasm of bladder ([place])   . Syncope 03/04/2018  . VT (ventricular tachycardia) ([place]) 03/04/2018    Allergies No Known Allergies  IV Location/Drains/Wounds Patient Lines/Drains/Airways Status   Active Line/Drains/Airways    Name:   Placement date:   Placement time:   Site:   Days:   Peripheral IV 07/29/18 Right Forearm   07/29/18    1154    Forearm   less than 1          Labs/Imaging Results for orders placed or performed during the hospital encounter of 07/29/18 (from the past 48 hour(s))  Basic metabolic panel     Status: Abnormal   Collection Time: 07/29/18  1:26 PM  Result Value Ref Range   Sodium 142 135 - 145 mmol/L   Potassium 4.6 3.5 - 5.1  mmol/L   Chloride 103 98 - 111 mmol/L   CO2 32 22 - 32 mmol/L   Glucose, Bld 115 (H) 70 - 99 mg/dL   BUN 17 8 - 23 mg/dL   Creatinine, Ser 0.83 0.61 - 1.24 mg/dL   Calcium 8.4 (L) 8.9 - 10.3 mg/dL   GFR calc non Af Amer >60 >60 mL/min   GFR calc Af Amer >60 >60 mL/min    Comment: (NOTE) The eGFR has been calculated using the CKD EPI equation. This calculation has not been validated in all clinical situations. eGFR's persistently <60 mL/min signify possible Chronic Kidney Disease.    Anion gap 7 5 - 15    Comment: Performed at [facility], [place] [address], [place], [place] [address]  CBC with Differential     Status: Abnormal   Collection Time: 07/29/18  1:26 PM  Result Value Ref Range   WBC 12.4 (H) 4.0 - 10.5 K/uL   RBC 3.75 (L) 4.22 - 5.81 MIL/uL   Hemoglobin 11.1 (L) 13.0 - 17.0 g/dL   HCT 36.3 (L) 39.0 - 52.0 %   MCV 96.8 78.0 - 100.0 fL   MCH 29.6 26.0 - 34.0 pg   MCHC 30.6 30.0 - 36.0 g/dL   RDW 15.2 11.5 - 15.5 %   Platelets 276 150 - 400 K/uL   Neutrophils  Relative % 86 %   Neutro Abs 10.6 (H) 1.7 - 7.7 K/uL   Lymphocytes Relative 6 %   Lymphs Abs 0.8 0.7 - 4.0 K/uL   Monocytes Relative 8 %   Monocytes Absolute 1.0 0.1 - 1.0 K/uL   Eosinophils Relative 0 %   Eosinophils Absolute 0.0 0.0 - 0.7 K/uL   Basophils Relative 0 %   Basophils Absolute 0.0 0.0 - 0.1 K/uL    Comment: Performed at [facility], [place] [address] [place], [place] [address]   Dg Chest 2 View  Result Date: 07/29/2018 CLINICAL DATA:  Family states he was brought in to have "fluid drained from his lung" and had sob increasing since yesterday. EXAM: CHEST - 2 VIEW COMPARISON:  None. FINDINGS: Patient has a LEFT-sided transvenous pacemaker with leads to the RIGHT atrium and RIGHT ventricle. The heart margins are obscured by significant LEFT lung opacity. There is atherosclerotic calcification of the thoracic aorta. Opacity spares only the apex and is consistent with  pleural effusion and atelectasis or infiltrate. The RIGHT lung is clear. Previous vertebral augmentation. IMPRESSION: Significant opacity in the LEFT hemithorax, consistent with pleural effusion and atelectasis or infiltrate. Aortic atherosclerosis.  (ICD10-I70.0) Electronically Signed   By: Nolon Nations M.D.   On: 07/29/2018 13:08   Ct Chest W Contrast  Result Date: 07/29/2018 CLINICAL DATA:  Shortness of breath. EXAM: CT CHEST WITH CONTRAST TECHNIQUE: Multidetector CT imaging of the chest was performed during intravenous contrast administration. CONTRAST:  79m OMNIPAQUE IOHEXOL 300 MG/ML  SOLN COMPARISON:  Radiographs of July 29, 2018. FINDINGS: Cardiovascular: Atherosclerosis of thoracic aorta is noted without aneurysm formation. Coronary artery calcifications are noted. Normal cardiac size. No pericardial effusion. Mediastinum/Nodes: Thyroid gland and esophagus are unremarkable. No significant adenopathy is noted. Lungs/Pleura: 10.8 x 10.3 x 5.0 cm probable mass is seen medially in left upper lobe which is resulting in atelectasis of left upper lobe. Several smaller masses are noted in the collapsed left lung concerning for metastatic disease. The largest measures 4.1 x 3.1 cm. Soft tissue material is noted in the left mainstem bronchus and its branches consistent with aspirated material or mucous plugging. Large left pleural effusion is noted with atelectasis of the left lower lobe and significant atelectasis of left upper lobe as previously described. Multiple rounded densities are noted in the right lung consistent with metastatic disease. The largest measures 2.4 cm in right middle lobe. Some degree of emphysematous disease is noted in the right upper lobe. Upper Abdomen: No acute abnormality. Musculoskeletal: No chest wall abnormality. No acute or significant osseous findings. IMPRESSION: Large left pleural effusion is noted with complete atelectasis of the left lower lobe and significant  atelectasis of the left upper lobe. 10.8 x 10.3 x 5 cm mass is noted medially in the left upper lobe consistent with malignancy. Probable mucus plugging or aspirated material is noted in the left mainstem bronchus. Multiple pulmonary masses are noted in both lungs consistent with metastatic disease. Further evaluation with PET scan and bronchoscopy is recommended. Coronary artery calcifications are noted suggesting coronary artery disease. Aortic Atherosclerosis (ICD10-I70.0) and Emphysema (ICD10-J43.9). Electronically Signed   By: JMarijo Conception M.D.   On: 07/29/2018 16:09    Pending Labs Unresulted Labs (From admission, onward)   Start     Ordered   07/29/18 1551  HIV antibody (Routine Screening)  Once,   R     07/29/18 1550   07/29/18 1551  Culture, sputum-assessment  Once,  R     07/29/18 1550   07/29/18 1551  Gram stain  Once,   R     07/29/18 1550   07/29/18 1551  Strep pneumoniae urinary antigen  Once,   R     07/29/18 1550   07/29/18 1511  Culture, blood (routine x 2)  BLOOD CULTURE X 2,   R     07/29/18 1510   Signed and Held  Comprehensive metabolic panel  Tomorrow morning,   R     Signed and Held   Signed and Held  CBC  Tomorrow morning,   R     Signed and Held   Signed and Held  Protime-INR  Tomorrow morning,   R     Signed and Held      Vitals/Pain Today's Vitals   07/29/18 1112 07/29/18 1151 07/29/18 1201 07/29/18 1506  BP: 109/66   (!) 154/59  Pulse: 70   77  Resp: (!) 24   (!) 24  Temp: 98.5 F (36.9 C)     TempSrc: Oral     SpO2: 98%  99% 100%  PainSc:  0-No pain      Isolation Precautions No active isolations  Medications Medications  cefTRIAXone (ROCEPHIN) 1 g in sodium chloride 0.9 % 100 mL IVPB (has no administration in time range)  azithromycin (ZITHROMAX) tablet 500 mg (has no administration in time range)  albuterol (PROVENTIL) (2.5 MG/3ML) 0.083% nebulizer solution 5 mg (5 mg Nebulization Given 07/29/18 1200)  iohexol (OMNIPAQUE) 300 MG/ML  solution 75 mL (75 mLs Intravenous Contrast Given 07/29/18 1541)    Mobility walks with device

## 2018-07-30 ENCOUNTER — Inpatient Hospital Stay (HOSPITAL_COMMUNITY): Payer: Medicare Other

## 2018-07-30 DIAGNOSIS — Z515 Encounter for palliative care: Secondary | ICD-10-CM

## 2018-07-30 DIAGNOSIS — J449 Chronic obstructive pulmonary disease, unspecified: Secondary | ICD-10-CM

## 2018-07-30 DIAGNOSIS — J189 Pneumonia, unspecified organism: Principal | ICD-10-CM

## 2018-07-30 DIAGNOSIS — J90 Pleural effusion, not elsewhere classified: Secondary | ICD-10-CM

## 2018-07-30 DIAGNOSIS — I4891 Unspecified atrial fibrillation: Secondary | ICD-10-CM

## 2018-07-30 LAB — HIV ANTIBODY (ROUTINE TESTING W REFLEX): HIV SCREEN 4TH GENERATION: NONREACTIVE

## 2018-07-30 LAB — PROTIME-INR
INR: 1
PROTHROMBIN TIME: 13.1 s (ref 11.4–15.2)

## 2018-07-30 LAB — BODY FLUID CELL COUNT WITH DIFFERENTIAL
Eos, Fluid: 1 %
Lymphs, Fluid: 60 %
Monocyte-Macrophage-Serous Fluid: 21 % — ABNORMAL LOW (ref 50–90)
Neutrophil Count, Fluid: 18 % (ref 0–25)
WBC FLUID: 305 uL (ref 0–1000)

## 2018-07-30 LAB — COMPREHENSIVE METABOLIC PANEL
ALK PHOS: 87 U/L (ref 38–126)
ALT: 10 U/L (ref 0–44)
ANION GAP: 6 (ref 5–15)
AST: 15 U/L (ref 15–41)
Albumin: 3.1 g/dL — ABNORMAL LOW (ref 3.5–5.0)
BUN: 18 mg/dL (ref 8–23)
CALCIUM: 8.7 mg/dL — AB (ref 8.9–10.3)
CO2: 32 mmol/L (ref 22–32)
CREATININE: 0.81 mg/dL (ref 0.61–1.24)
Chloride: 100 mmol/L (ref 98–111)
GFR calc Af Amer: >60 mL/min (ref >60)
Glucose, Bld: 103 mg/dL — ABNORMAL HIGH (ref 70–99)
Potassium: 5.2 mmol/L — ABNORMAL HIGH (ref 3.5–5.1)
SODIUM: 138 mmol/L (ref 135–145)
Total Bilirubin: 0.5 mg/dL (ref 0.3–1.2)
Total Protein: 6 g/dL — ABNORMAL LOW (ref 6.5–8.1)

## 2018-07-30 LAB — LACTATE DEHYDROGENASE, PLEURAL OR PERITONEAL FLUID: LD, Fluid: 447 U/L — ABNORMAL HIGH (ref 3–23)

## 2018-07-30 LAB — GRAM STAIN

## 2018-07-30 LAB — GLUCOSE, PLEURAL OR PERITONEAL FLUID: GLUCOSE FL: 94 mg/dL

## 2018-07-30 LAB — CBC
HCT: 36.8 % — ABNORMAL LOW (ref 39.0–52.0)
HEMOGLOBIN: 11.1 g/dL — AB (ref 13.0–17.0)
MCH: 29.5 pg (ref 26.0–34.0)
MCHC: 30.2 g/dL (ref 30.0–36.0)
MCV: 97.9 fL (ref 78.0–100.0)
PLATELETS: 282 10*3/uL (ref 150–400)
RBC: 3.76 MIL/uL — ABNORMAL LOW (ref 4.22–5.81)
RDW: 15.3 % (ref 11.5–15.5)
WBC: 11 10*3/uL — ABNORMAL HIGH (ref 4.0–10.5)

## 2018-07-30 LAB — EXPECTORATED SPUTUM ASSESSMENT W REFEX TO RESP CULTURE

## 2018-07-30 LAB — PROTEIN, PLEURAL OR PERITONEAL FLUID: TOTAL PROTEIN, FLUID: 3.5 g/dL

## 2018-07-30 LAB — ALBUMIN, PLEURAL OR PERITONEAL FLUID: Albumin, Fluid: 2.2 g/dL

## 2018-07-30 LAB — MRSA PCR SCREENING: MRSA BY PCR: NEGATIVE

## 2018-07-30 LAB — EXPECTORATED SPUTUM ASSESSMENT W GRAM STAIN, RFLX TO RESP C

## 2018-07-30 LAB — STREP PNEUMONIAE URINARY ANTIGEN: Strep Pneumo Urinary Antigen: NEGATIVE

## 2018-07-30 MED ORDER — LIDOCAINE HCL 1 % IJ SOLN
INTRAMUSCULAR | Status: AC
  Filled 2018-07-30: qty 10

## 2018-07-30 MED ORDER — DM-GUAIFENESIN ER 30-600 MG PO TB12
2.0000 | ORAL_TABLET | Freq: Two times a day (BID) | ORAL | Status: DC
  Administered 2018-07-30 – 2018-07-31 (×3): 2 via ORAL
  Filled 2018-07-30 (×3): qty 2

## 2018-07-30 MED ORDER — SODIUM CHLORIDE 0.9 % IV SOLN
INTRAVENOUS | Status: DC
  Administered 2018-07-30: 12:00:00 via INTRAVENOUS

## 2018-07-30 NOTE — Consult Note (Signed)
Consultation Note Date: 07/30/2018   Patient Name: Mark [name]  DOB: February 03, 1932  MRN: [id number]  Age / Sex: 84 y.o., male  PCP: Wenda Low, MD Referring Physician: Mariel Aloe, MD  Reason for Consultation: Establishing goals of care  HPI/Patient Profile: 84 y.o. male admitted on 07/29/2018  Clinical Assessment and Goals of Care:  84 year old gentleman with COPD, coronary artery disease, history of pacemaker placement dyslipidemia. History of bladder cancer. Patient has been admitted from [facility] facility with shortness of breath and was found to have pneumonia and pleural effusion. Patient was placed on antibiotics. Patient is to undergo thoracenteses. Patient complains of cough and congestion that his heart was spent out at times and occasional shortness of breath. Patient states that he has been falling as well. Palliative consultation for ongoing goals of care discussions.  Patient is resting in bed. At times he has coughing spells. There is no family present at the bedside. I introduced myself and palliative care as follows:  Palliative medicine is specialized medical care for people living with serious illness. It focuses on providing relief from the symptoms and stress of a serious illness. The goal is to improve quality of life for both the patient and the family.  Discussed with patient about the reason for his hospitalization. He is aware that he has had a history of bladder cancer. He states he underwent several courses of treatment for that. We discussed about issues pertinent to this hospitalization. On imaging patient has been found to have a left upper lobe mass consistent with a malignancy. Discussed that the fluid obtained during thoracenteses will be sent for histology.   Goals wishes and values discussed with the patient. Call placed however unable to reach son came by. His voice  mailbox is full and was not able to accept anymore messages. Social worker notes reviewed. Agree with patient going back to [facility] on discharge. Recommend palliative services follow the patient over there. Await pleural fluid cytology results. Thank you for the consult.  NEXT OF KIN  son Nichola Warren [address] [place] with DNR DNI Agree with Thoracentesis as a comfort measures Continue current mode of care Recommend palliative care follow up with the patient at [facility] after discharge No additional PMT specific recommendations at this time.  Thank you for the consult.  Code Status/Advance Care Planning:  DNR    Symptom Management:    as above   Palliative Prophylaxis:   Delirium Protocol   Psycho-social/Spiritual:   Desire for further Chaplaincy support:no  Additional Recommendations: Education on Hospice  Prognosis:   < 12 months  Discharge Planning:  Recommend palliative care at [facility]       Primary Diagnoses: Present on Admission: . CAP (community acquired pneumonia) . Pleural effusion   I have reviewed the medical record, interviewed the patient and family, and examined the patient. The following aspects are pertinent.  Past Medical History:  Diagnosis Date  . Athscl heart disease of native coronary artery  w/o ang pctrs   . Cardiac pacemaker   . COPD (chronic obstructive pulmonary disease) ([place])   . Hyperlipidemia   . Insomnia   . Major depressive disorder   . Malignant neoplasm of bladder ([place])   . Syncope 03/04/2018  . VT (ventricular tachycardia) ([place]) 03/04/2018   Social History   Socioeconomic History  . Marital status: Widowed    Spouse name: Not on file  . Number of children: Not on file  . Years of education: Not on file  . Highest education level: Not on file  Occupational History  . Occupation: Retired  Scientific laboratory technician  . Financial resource strain: Not on file  . Food insecurity:     Worry: Not on file    Inability: Not on file  . Transportation needs:    Medical: Not on file    Non-medical: Not on file  Tobacco Use  . Smoking status: Former Smoker    Types: Cigarettes    Last attempt to quit: 11/23/2016    Years since quitting: 1.6  . Smokeless tobacco: Never Used  Substance and Sexual Activity  . Alcohol use: Never    Frequency: Never  . Drug use: Never  . Sexual activity: Not on file  Lifestyle  . Physical activity:    Days per week: Not on file    Minutes per session: Not on file  . Stress: Not on file  Relationships  . Social connections:    Talks on phone: Not on file    Gets together: Not on file    Attends religious service: Not on file    Active member of club or organization: Not on file    Attends meetings of clubs or organizations: Not on file    Relationship status: Not on file  Other Topics Concern  . Not on file  Social History Narrative   Lives in [facility]    Family History  Problem Relation Age of Onset  . CAD Neg Hx    Scheduled Meds: . albuterol  2.5 mg Nebulization QID  . amiodarone  200 mg Oral Daily  . azithromycin  500 mg Oral Q24H  . cholecalciferol  2,000 Units Oral Daily  . dextromethorphan-guaiFENesin  2 tablet Oral BID  . lidocaine      . Melatonin  3 mg Oral QHS  . metoprolol succinate  25 mg Oral Daily  . mometasone-formoterol  2 puff Inhalation BID  . sertraline  50 mg Oral Daily  . tamsulosin  0.4 mg Oral QHS   Continuous Infusions: . sodium chloride    . sodium chloride 75 mL/hr at 07/30/18 1136  . cefTRIAXone (ROCEPHIN)  IV 1 g (07/29/18 1808)   PRN Meds:.sodium chloride, acetaminophen **OR** acetaminophen, albuterol, ALPRAZolam, lidocaine, traMADol Medications Prior to Admission:  Prior to Admission medications   Medication Sig Start Date End Date Taking? Authorizing Provider  acetaminophen (TYLENOL) 325 MG tablet Take 650 mg by mouth every 6 (six) hours as needed for mild pain.    Yes [provider]  albuterol (PROVENTIL) (2.5 MG/3ML) 0.083% nebulizer solution Take 2.5 mg by nebulization 3 (three) times daily as needed for wheezing or shortness of breath.    Yes [provider]  ALPRAZolam (XANAX) 0.25 MG tablet Take 0.25 mg by mouth every 6 (six) hours as needed for anxiety. 07/28/18  Yes [provider]  amiodarone (PACERONE) 200 MG tablet Take 1 tablet (200 mg total) by mouth daily. 04/11/18  Yes Barrett, Evelene Croon, PA-C  amoxicillin-clavulanate (AUGMENTIN) 875-125 MG tablet Take 1 tablet by mouth 2 (two) times daily. 07/29/18 08/07/18 Yes [provider]  Cholecalciferol (VITAMIN D3) 1000 units CAPS Take 2,000 Units by mouth 2 (two) times daily.   Yes [provider]  Fluticasone-Salmeterol (ADVAIR) 250-50 MCG/DOSE AEPB Inhale 1 puff into the lungs 2 (two) times daily.    Yes [provider]  Lidocaine HCl (ASPERCREME W/LIDOCAINE) 4 % CREA Apply 1 application topically See admin instructions. Apply to right knee three times daily as needed for pain   Yes [provider]  Melatonin 3 MG TABS Take 3 mg by mouth at bedtime. 07/28/18  Yes [provider]  metoprolol succinate (TOPROL-XL) 25 MG 24 hr tablet Take 1 tablet (25 mg total) by mouth daily. Take with or immediately following a meal. 04/11/18  Yes Barrett, Evelene Croon, PA-C  sertraline (ZOLOFT) 50 MG tablet Take 50 mg by mouth daily.    Yes [provider]  tamsulosin (FLOMAX) 0.4 MG CAPS capsule Take 0.4 mg by mouth at bedtime.    Yes [provider]  traMADol (ULTRAM) 50 MG tablet Take 50 mg by mouth every 6 (six) hours as needed (pain).    Yes [provider]   No Known Allergies Review of Systems +cough +congestion  Physical Exam Elderly gentleman resting in bed Decreased breath sounds particularly on the left side S1-S2 Abdomen is soft and non distended No edema Awake and alert  Vital Signs: BP (!) 144/65 (BP Location: Right Arm)    Pulse 70   Temp (!) 97.5 F (36.4 C) (Oral)   Resp 20   Ht 5\' 10"  (1.778 m)   Wt 66.4 kg (146 lb 6.2 oz)   SpO2 92%   BMI 21.00 kg/m  Pain Scale: 0-10   Pain Score: 0-No pain   SpO2: SpO2: 92 % O2 Device:SpO2: 92 % O2 Flow Rate: .O2 Flow Rate (L/min): 6 L/min  IO: Intake/output summary:   Intake/Output Summary (Last 24 hours) at 07/30/2018 1652 Last data filed at 07/30/2018 0315 Gross per 24 hour  Intake 0 ml  Output 350 ml  Net -350 ml    LBM: Last BM Date: 07/29/18 Baseline Weight: Weight: 65.5 kg (144 lb 6.4 oz) Most recent weight: Weight: 66.4 kg (146 lb 6.2 oz)     Palliative Assessment/Data:   PPS 40%  Time In: 12 Time Out: 1300 Time Total: 60 min  Greater than 50%  of this time was spent counseling and coordinating care related to the above assessment and plan.  Signed by: Loistine Chance, MD  [phone]  Please contact Palliative Medicine Team phone at [phone] for questions and concerns.  For individual provider: See Shea Evans

## 2018-07-30 NOTE — Progress Notes (Signed)
PROGRESS NOTE    Mark [name]  XVQ:[id number] DOB: January 30, 1932 DOA: 07/29/2018 PCP: Wenda Low, MD   Brief Narrative: Mark [name] is a 84 y.o. male with medical history significant of COPD, depression, coronary artery disease, status post pacemaker placement, hyperlipidemia, bladder cancer. He presented with dyspnea and hypoxia and found to have pneumonia and pleural effusion.   Assessment & Plan:   Active Problems:   CAP (community acquired pneumonia)   Pleural effusion   Community acquired pneumonia -Continue Ceftriaxone and azithromycin -Blood cultures pending -Sputum culture pending (rare gram variable rods)  Left sided pleural effusion Plan for thoracentesis today. On room air.  Lung masses Concern for malignancy. Focus is comfort. Palliative care consulted on admission.  Orthopnea Secondary to pleural effusion.  COPD -Continue albuterol and Dulera  Leukocytosis Stable. In setting of pneumonia  History of bladder cancer S/p chemo and radiation.  Hyperkalemia Mid. Repeat BMP in AM  Atrial fibrillation -Continue metoprolol and amiodarone  DVT prophylaxis: SCDs Code Status:   Code Status: DNR Family Communication: None at bedside Disposition Plan: Discharge pending goals of care discussions   Consultants:   Palliative care medicine  Procedures:   None  Antimicrobials:  Ceftriaxone  Azithromycin    Subjective: Coughing improved.  Objective: Vitals:   07/29/18 2138 07/30/18 0618 07/30/18 0741 07/30/18 1358  BP: (!) 160/67 129/66  (!) 151/64  Pulse: 69 70  70  Resp: 20 20  (!) 24  Temp: 98.5 F (36.9 C) 98.4 F (36.9 C)  98.3 F (36.8 C)  TempSrc: Oral Oral  Oral  SpO2: 94% 97% (!) 86% 100%  Weight:  66.4 kg (146 lb 6.2 oz)    Height:        Intake/Output Summary (Last 24 hours) at 07/30/2018 1527 Last data filed at 07/30/2018 0315 Gross per 24 hour  Intake 0 ml  Output 350 ml  Net -350 ml   Filed Weights   07/29/18 1702  07/30/18 0618  Weight: 65.5 kg (144 lb 6.4 oz) 66.4 kg (146 lb 6.2 oz)    Examination:  General exam: Appears calm and comfortable Respiratory system: Decreased breath sounds on left side. Respiratory effort normal. Cardiovascular system: S1 & S2 heard, RRR. No murmurs, rubs, gallops or clicks. Gastrointestinal system: Abdomen is nondistended, soft and nontender. Normal bowel sounds heard. Central nervous system: Alert and oriented. No focal neurological deficits. Extremities: No edema. No calf tenderness Skin: No cyanosis. No rashes Psychiatry: Judgement and insight appear normal. Mood & affect appropriate.     Data Reviewed: I have personally reviewed following labs and imaging studies  CBC: Recent Labs  Lab 07/29/18 1326 07/30/18 0541  WBC 12.4* 11.0*  NEUTROABS 10.6*  --   HGB 11.1* 11.1*  HCT 36.3* 36.8*  MCV 96.8 97.9  PLT 276 093   Basic Metabolic Panel: Recent Labs  Lab 07/29/18 1326 07/30/18 0541  NA 142 138  K 4.6 5.2*  CL 103 100  CO2 32 32  GLUCOSE 115* 103*  BUN 17 18  CREATININE 0.83 0.81  CALCIUM 8.4* 8.7*   GFR: Estimated Creatinine Clearance: 64.9 mL/min (by C-G formula based on SCr of 0.81 mg/dL). Liver Function Tests: Recent Labs  Lab 07/30/18 0541  AST 15  ALT 10  ALKPHOS 87  BILITOT 0.5  PROT 6.0*  ALBUMIN 3.1*   No results for input(s): LIPASE, AMYLASE in the last 168 hours. No results for input(s): AMMONIA in the last 168 hours. Coagulation Profile: Recent Labs  Lab 07/30/18 0541  INR 1.00   Cardiac Enzymes: No results for input(s): CKTOTAL, CKMB, CKMBINDEX, TROPONINI in the last 168 hours. BNP (last 3 results) No results for input(s): PROBNP in the last 8760 hours. HbA1C: No results for input(s): HGBA1C in the last 72 hours. CBG: No results for input(s): GLUCAP in the last 168 hours. Lipid Profile: No results for input(s): CHOL, HDL, LDLCALC, TRIG, CHOLHDL, LDLDIRECT in the last 72 hours. Thyroid Function Tests: No  results for input(s): TSH, T4TOTAL, FREET4, T3FREE, THYROIDAB in the last 72 hours. Anemia Panel: No results for input(s): VITAMINB12, FOLATE, FERRITIN, TIBC, IRON, RETICCTPCT in the last 72 hours. Sepsis Labs: No results for input(s): PROCALCITON, LATICACIDVEN in the last 168 hours.  Recent Results (from the past 240 hour(s))  Culture, blood (routine x 2)     Status: None (Preliminary result)   Collection Time: 07/29/18  3:19 PM  Result Value Ref Range Status   Specimen Description   Final    BLOOD LEFT WRIST Performed at [facility], [place] [address], [place], [place] [address]    Special Requests   Final    BOTTLES DRAWN AEROBIC AND ANAEROBIC Blood Culture adequate volume Performed at [facility] [address], [place], [place] [address]    Culture   Final    NO GROWTH < 24 HOURS Performed at [facility] [address], [place], [place] [address]    Report Status PENDING  Incomplete  Culture, blood (routine x 2)     Status: None (Preliminary result)   Collection Time: 07/29/18  3:23 PM  Result Value Ref Range Status   Specimen Description   Final    BLOOD LEFT ARM Performed at [facility] [address], [place], [place] [address]    Special Requests   Final    BOTTLES DRAWN AEROBIC AND ANAEROBIC Blood Culture adequate volume Performed at [facility] [address] [place], [place] [address]    Culture   Final    NO GROWTH < 24 HOURS Performed at [facility] [address] [place], [place] [address]    Report Status PENDING  Incomplete  MRSA PCR Screening     Status: None   Collection Time: 07/29/18 11:40 PM  Result Value Ref Range Status   MRSA by PCR NEGATIVE NEGATIVE Final    Comment:        The GeneXpert MRSA Assay (FDA approved for NASAL specimens only), is one component of a comprehensive MRSA colonization surveillance program. It is not intended to diagnose  MRSA infection nor to guide or monitor treatment for MRSA infections. Performed at [facility], [place] [address], [place], [place] [address]   Culture, sputum-assessment     Status: None   Collection Time: 07/30/18  7:31 AM  Result Value Ref Range Status   Specimen Description SPUTUM  Final   Special Requests NONE  Final   Sputum evaluation   Final    THIS SPECIMEN IS ACCEPTABLE FOR SPUTUM CULTURE Performed at [facility] [address], [place] [address] [place], [place] [address]    Report Status 07/30/2018 FINAL  Final  Culture, respiratory     Status: None (Preliminary result)   Collection Time: 07/30/18  7:31 AM  Result Value Ref Range Status   Specimen Description   Final    SPUTUM Performed at [facility] [address] [place], [place] [address]    Special Requests  Final    NONE Reflexed from [phone] Performed at [facility], [place] [address] [place], [place] [address]    Gram Stain   Final    RARE WBC PRESENT, PREDOMINANTLY PMN RARE GRAM VARIABLE ROD Performed at [facility], [place] [address], [place], [place] [address]    Culture PENDING  Incomplete   Report Status PENDING  Incomplete         Radiology Studies: Dg Chest 2 View  Result Date: 07/29/2018 CLINICAL DATA:  Family states he was brought in to have "fluid drained from his lung" and had sob increasing since yesterday. EXAM: CHEST - 2 VIEW COMPARISON:  None. FINDINGS: Patient has a LEFT-sided transvenous pacemaker with leads to the RIGHT atrium and RIGHT ventricle. The heart margins are obscured by significant LEFT lung opacity. There is atherosclerotic calcification of the thoracic aorta. Opacity spares only the apex and is consistent with pleural effusion and atelectasis or infiltrate. The RIGHT lung is clear. Previous vertebral augmentation. IMPRESSION: Significant opacity in the LEFT hemithorax, consistent with pleural effusion and  atelectasis or infiltrate. Aortic atherosclerosis.  (ICD10-I70.0) Electronically Signed   By: Nolon Nations M.D.   On: 07/29/2018 13:08   Ct Chest W Contrast  Result Date: 07/29/2018 CLINICAL DATA:  Shortness of breath. EXAM: CT CHEST WITH CONTRAST TECHNIQUE: Multidetector CT imaging of the chest was performed during intravenous contrast administration. CONTRAST:  40mL OMNIPAQUE IOHEXOL 300 MG/ML  SOLN COMPARISON:  Radiographs of July 29, 2018. FINDINGS: Cardiovascular: Atherosclerosis of thoracic aorta is noted without aneurysm formation. Coronary artery calcifications are noted. Normal cardiac size. No pericardial effusion. Mediastinum/Nodes: Thyroid gland and esophagus are unremarkable. No significant adenopathy is noted. Lungs/Pleura: 10.8 x 10.3 x 5.0 cm probable mass is seen medially in left upper lobe which is resulting in atelectasis of left upper lobe. Several smaller masses are noted in the collapsed left lung concerning for metastatic disease. The largest measures 4.1 x 3.1 cm. Soft tissue material is noted in the left mainstem bronchus and its branches consistent with aspirated material or mucous plugging. Large left pleural effusion is noted with atelectasis of the left lower lobe and significant atelectasis of left upper lobe as previously described. Multiple rounded densities are noted in the right lung consistent with metastatic disease. The largest measures 2.4 cm in right middle lobe. Some degree of emphysematous disease is noted in the right upper lobe. Upper Abdomen: No acute abnormality. Musculoskeletal: No chest wall abnormality. No acute or significant osseous findings. IMPRESSION: Large left pleural effusion is noted with complete atelectasis of the left lower lobe and significant atelectasis of the left upper lobe. 10.8 x 10.3 x 5 cm mass is noted medially in the left upper lobe consistent with malignancy. Probable mucus plugging or aspirated material is noted in the left mainstem  bronchus. Multiple pulmonary masses are noted in both lungs consistent with metastatic disease. Further evaluation with PET scan and bronchoscopy is recommended. Coronary artery calcifications are noted suggesting coronary artery disease. Aortic Atherosclerosis (ICD10-I70.0) and Emphysema (ICD10-J43.9). Electronically Signed   By: Marijo Conception, M.D.   On: 07/29/2018 16:09        Scheduled Meds: . lidocaine      . albuterol  2.5 mg Nebulization QID  . amiodarone  200 mg Oral Daily  . azithromycin  500 mg Oral Q24H  . cholecalciferol  2,000 Units Oral Daily  . dextromethorphan-guaiFENesin  2 tablet Oral BID  . Melatonin  3 mg Oral QHS  .  metoprolol succinate  25 mg Oral Daily  . mometasone-formoterol  2 puff Inhalation BID  . sertraline  50 mg Oral Daily  . tamsulosin  0.4 mg Oral QHS   Continuous Infusions: . sodium chloride    . sodium chloride 75 mL/hr at 07/30/18 1136  . cefTRIAXone (ROCEPHIN)  IV 1 g (07/29/18 1808)     LOS: 1 day     Cordelia Poche, MD [facility] 07/30/2018, 3:27 PM Pager: [phone]  If 7PM-7AM, please contact night-coverage [email or web address] 07/30/2018, 3:27 PM

## 2018-07-30 NOTE — Procedures (Signed)
Ultrasound-guided diagnostic and therapeutic left thoracentesis performed yielding 2 liters of blood-tinged  fluid. No immediate complications. Follow-up chest x-ray pending. A portion of the fluid was sent to the lab for preordered studies.

## 2018-07-30 NOTE — Evaluation (Signed)
SLP Cancellation Note  Patient Details Name: Mark [name] MRN: [id number] DOB: 1932-08-13   Cancelled treatment:       Reason Eval/Treat Not Completed: Other (comment);Medical issues which prohibited therapy(pt is npo for thoracentesis, RN reports tolerance of po medication with water, will continue efforts, pt had MBS April 2019 with asp via straw - advised RN to avoid straws)   [name] 07/30/2018, 10:05 AM   [name], [place] [facility] SLP [phone]

## 2018-07-30 NOTE — Clinical Social Work Note (Signed)
Clinical Social Work Assessment  Patient Details  Name: Mark [name] MRN: [id number] Date of Birth: [birth year]  Date of referral:  07/30/18               Reason for consult:  Discharge Planning                Permission sought to share information with:  Facility Sport and exercise psychologist, Family Supports Permission granted to share information::  Yes, Verbal Permission Granted  Name::     [place]::  [facility]  Relationship::  Son  Contact Information:  [phone]  Housing/Transportation Living arrangements for the past 2 months:  [place] of Information:  Patient, Facility, Adult Children Patient Interpreter Needed:  None Criminal Activity/Legal Involvement Pertinent to Current Situation/Hospitalization:  No - Comment as needed Significant Relationships:  Adult Children Lives with:  Facility Resident Do you feel safe going back to the place where you live?  Yes Need for family participation in patient care:  No (Coment)  Care giving concerns:  Patient from [facility]. Patient's son reported that patient uses a walker at baseline and needs assistance with bathing and dressing. Patient's son reported that patient hasnt been able to walk outside of room since Friday. Patient admitted with CAP (community acquired pneumonia). PT consulted, evaluation pending.    Social Worker assessment / plan:  CSW spoke with patient at bedside regarding discharge planning. Patient confirmed that he is from [facility] and was not able to recall how [name] he had been at the facility. Patient reported that he has been receiving good food and service and plans to return at dc. Patient granted CSW verbal permission to speak with his son further to discuss discharge planning.  CSW contacted patient's son and discussed patient's discharge planning. Patient's son reported that he is in agreement with patient returning to [facility]. CSW informed patient's son that  PT was consulted and will be evaluating patient, patient's son verbalized understanding. CSW agreed to follow up with patient's son after PT evaluation was completed.  CSW will complete FL2 after PT recommendation is made.  CSW will continue to follow and assist with discharge planning.  Employment status:  Retired Forensic scientist:  Medicare PT Recommendations:  Not assessed at this time [place] / Referral to community resources:  Other (Comment Required)(Patient admitted from [facility])  Patient/Family's Response to care:  Patient/patient's son appreciative of CSW assistance with discharge planning.   Patient/Family's Understanding of and Emotional Response to Diagnosis, Current Treatment, and Prognosis:  Patient presented calm and verbalized plan to dc back to current ALF. Patient's son involved in patient's care and verbalized understanding of current treatment plan. Patient and patient's son hopeful that patient can return to ALF when medically stable.   Emotional Assessment Appearance:  Appears stated age Attitude/Demeanor/Rapport:  Other(Cooperative) Affect (typically observed):  Appropriate, Calm Orientation:  Oriented to Self, Oriented to Place, Oriented to  Time, Oriented to Situation Alcohol / Substance use:  Not Applicable Psych involvement (Current and /or in the community):  No (Comment)  Discharge Needs  Concerns to be addressed:  Care Coordination Readmission within the last 30 days:  No Current discharge risk:  None Barriers to Discharge:  Continued Medical Work up   [employer], [place] 07/30/2018, 2:39 PM

## 2018-07-31 LAB — CBC
HCT: 35.3 % — ABNORMAL LOW (ref 39.0–52.0)
Hemoglobin: 10.8 g/dL — ABNORMAL LOW (ref 13.0–17.0)
MCH: 29.4 pg (ref 26.0–34.0)
MCHC: 30.6 g/dL (ref 30.0–36.0)
MCV: 96.2 fL (ref 78.0–100.0)
PLATELETS: 274 10*3/uL (ref 150–400)
RBC: 3.67 MIL/uL — ABNORMAL LOW (ref 4.22–5.81)
RDW: 15 % (ref 11.5–15.5)
WBC: 12.1 10*3/uL — ABNORMAL HIGH (ref 4.0–10.5)

## 2018-07-31 LAB — BASIC METABOLIC PANEL
Anion gap: 8 (ref 5–15)
BUN: 23 mg/dL (ref 8–23)
CHLORIDE: 101 mmol/L (ref 98–111)
CO2: 30 mmol/L (ref 22–32)
CREATININE: 0.89 mg/dL (ref 0.61–1.24)
Calcium: 8.7 mg/dL — ABNORMAL LOW (ref 8.9–10.3)
GFR calc Af Amer: >60 mL/min (ref >60)
GFR calc non Af Amer: >60 mL/min (ref >60)
Glucose, Bld: 104 mg/dL — ABNORMAL HIGH (ref 70–99)
Potassium: 4.7 mmol/L (ref 3.5–5.1)
SODIUM: 139 mmol/L (ref 135–145)

## 2018-07-31 LAB — PH, BODY FLUID: pH, Body Fluid: 7.3

## 2018-07-31 MED ORDER — ALPRAZOLAM 0.25 MG PO TABS: 0.2500 mg | ORAL_TABLET | Freq: Two times a day (BID) | ORAL | 0 refills | Status: AC | PRN

## 2018-07-31 MED ORDER — CEFDINIR 300 MG PO CAPS: 300.0000 mg | ORAL_CAPSULE | Freq: Two times a day (BID) | ORAL | Status: DC

## 2018-07-31 MED ORDER — ALBUTEROL SULFATE (2.5 MG/3ML) 0.083% IN NEBU
2.5000 mg | INHALATION_SOLUTION | Freq: Three times a day (TID) | RESPIRATORY_TRACT | Status: DC
  Administered 2018-07-31: 2.5 mg via RESPIRATORY_TRACT

## 2018-07-31 MED ORDER — TRAMADOL HCL 50 MG PO TABS: 50.0000 mg | ORAL_TABLET | Freq: Four times a day (QID) | ORAL | 0 refills | Status: AC | PRN

## 2018-07-31 MED ORDER — CEFDINIR 300 MG PO CAPS: 300.0000 mg | ORAL_CAPSULE | Freq: Two times a day (BID) | ORAL | 0 refills | Status: AC

## 2018-07-31 NOTE — Clinical Social Work Placement (Signed)
Patient returning to [facility]. Facility aware of patient's discharge and confirmed patient's ability to return. PTAR contacted, patient's family notified. Patient's RN can call report to [phone] Room 102, packet complete. CSW signing off, no other needs identified at this time.  CLINICAL SOCIAL WORK PLACEMENT  NOTE  Date:  07/31/2018  Patient Details  Name: Mark [name] MRN: [id number] Date of Birth: January 02, 1932  Clinical Social Work is seeking post-discharge placement for this patient at the [place] level of care (*CSW will initial, date and re-position this form in  chart as items are completed):  Yes   Patient/family provided with [facility] Work Department's list of facilities offering this level of care within the geographic area requested by the patient (or if unable, by the patient's family).  Yes   Patient/family informed of their freedom to choose among providers that offer the needed level of care, that participate in Medicare, Medicaid or managed care program needed by the patient, have an available bed and are willing to accept the patient.  Yes   Patient/family informed of [facility]'s ownership interest in [facility] and [facility], as well as of the fact that they are under no obligation to receive care at these facilities.  PASRR submitted to EDS on       PASRR number received on       Existing PASRR number confirmed on       FL2 transmitted to all facilities in geographic area requested by pt/family on 07/31/18     FL2 transmitted to all facilities within larger geographic area on       Patient informed that his/her managed care company has contracts with or will negotiate with certain facilities, including the following:        Yes   Patient/family informed of bed offers received.  Patient chooses bed at [facility]     Physician recommends and patient chooses bed at      Patient to be  transferred to [facility] on 07/31/18.  Patient to be transferred to facility by PTAR     Patient family notified on 07/31/18 of transfer.  Name of family member notified:  Angela Adam     PHYSICIAN       Additional Comment:    _______________________________________________ Burnis Medin, LCSW 07/31/2018, 2:37 PM

## 2018-07-31 NOTE — Discharge Instructions (Signed)
[place] were admitted because of fluid in your lung. This was taken out. There is concern that you may have a cancer in your chest. This may or may not need to be worked up based on your desires. I have included a information for follow-up with an oncologist, should you choose to pursue workup/treatment. You met with palliative care and the recommendation is for palliative care services at your ALF. You were also found to have a pneumonia and will be discharged with antibiotics. Your fluid may come back if it is related to an infection. If you have worsening trouble breathing or chest pain, please seek medical attention.

## 2018-07-31 NOTE — Progress Notes (Signed)
Patient is a&ox4, ambulatory. Belongings were given to   patients son. Instructions reviewed with [facility] representative [email or web address]. Questions, concerns denied.

## 2018-07-31 NOTE — Care Management Note (Signed)
Case Management Note  Patient Details  Name: Stace Peace MRN: [id number] Date of Birth: March 09, 1932  Subjective/Objective: PT recc HHPT. Patient to return back to ALF w/HHC-facility has their own.                   Action/Plan:d/c ALF w/HHC   Expected Discharge Date:  07/31/18               Expected Discharge Plan:  [place]  In-House Referral:  Clinical Social Work  Discharge planning Services  CM Consult  Post Acute Care Choice:    Choice offered to:     DME Arranged:    DME Agency:     HH Arranged:  PT HH Agency:  ([facility] has their own contract for HHPT.)  Status of Service:  Completed, signed off  If discussed at [employer], dates discussed:    Additional Comments:  Dessa Phi, RN 07/31/2018, 2:49 PM

## 2018-07-31 NOTE — Evaluation (Signed)
Clinical/Bedside Swallow Evaluation Patient Details  Name: Mark [name] MRN: [id number] Date of Birth: 12/21/1932  Today's Date: 07/31/2018 Time: SLP Start Time (ACUTE ONLY): 43 SLP Stop Time (ACUTE ONLY): 1250 SLP Time Calculation (min) (ACUTE ONLY): 30 min  Past Medical History:  Past Medical History:  Diagnosis Date  . 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 Cardiac pacemaker   . COPD (chronic obstructive pulmonary disease) ([place])   . Hyperlipidemia   . Insomnia   . Major depressive disorder   . Malignant neoplasm of bladder ([place])   . Syncope 03/04/2018  . VT (ventricular tachycardia) ([place]) 03/04/2018   Past Surgical History:  Past Surgical History:  Procedure Laterality Date  . PACEMAKER INSERTION  02/20108   in [place], [employer]   HPI:  84 yo adm with respiratory difficulties.  Pt found to have possible aspirates in airway and ? lung malignancy.  He is s/p thoracentesis with 2 liters of fluid removed.  Swallow eval ordered.  Pt has h/o dysphagia - FEES study done in [place] and [facility]    Assessment / Plan / Recommendation Clinical Impression  Pt with known h/o mild oropharyngeal dysphagia based on MBS done April 2019.  He continues today to present with symptoms consistent with those findings.  Multiple swallows followed by gurgly breathing quality and coughing noted with liquids pt took after attempting to swallow solids.  He demonstrates improved airway protection with masticating well and taking small sips AFTER swallowing the food first.  This pt has chronic dysphagia and SLP provided written education re compensation/aspiration mitigation strategies.  Pt stated several times during session, "I do not pay attention".   Advsied that he monitor closely and try to implement compensations.  All education completed, at this time SLP will sign off.  Please reorder if desired - anticipate pt will not deem necessary given his medical  issues/palliative care plan.  Thanks.   SLP Visit Diagnosis: Dysphagia, oropharyngeal phase (R13.12)    Aspiration Risk  Moderate aspiration risk    Diet Recommendation Dysphagia 3 (Mech soft);Thin liquid   Liquid Administration via: Cup;Straw Medication Administration: Whole meds with puree Supervision: Patient able to self feed Compensations: Slow rate;Small sips/bites;Follow solids with liquid Postural Changes: Seated upright at 90 degrees    Other  Recommendations Oral Care Recommendations: Oral care BID   Follow up Recommendations None      Frequency and Duration            Prognosis        Swallow Study   General Date of Onset: 07/31/18 HPI: 84 yo adm with respiratory difficulties.  Pt found to have possible aspirates in airway and ? lung malignancy.  He is s/p thoracentesis with 2 liters of fluid removed.  Swallow eval ordered.  Pt has h/o dysphagia - FEES study done in [place] and [facility] April 2019 at [facility].  Type of Study: Bedside Swallow Evaluation Previous Swallow Assessment: April 2019 trace aspiration thin, pharyngeal residuals - rec dys3/thin, pills with puree with precautions  Diet Prior to this Study: Regular;Thin liquids Temperature Spikes Noted: No Respiratory Status: Nasal cannula History of Recent Intubation: No Behavior/Cognition: Alert;Cooperative;Pleasant mood Oral Cavity Assessment: Dry Oral Care Completed by SLP: No Oral Cavity - Dentition: Dentures, top;Dentures, bottom(ill fitting per pt) Vision: Functional for self-feeding Self-Feeding Abilities: Able to feed self Patient Positioning: Upright in bed Baseline Vocal Quality: Normal Volitional Cough: Strong Volitional Swallow: Able to elicit  Oral/Motor/Sensory Function Overall Oral Motor/Sensory Function: Within functional limits   Ice Chips Ice chips: Not tested   Thin Liquid Thin Liquid: Impaired Pharyngeal  Phase Impairments: Multiple swallows;Cough - Immediate;Cough - Delayed;Wet Vocal  Quality    Nectar Thick Nectar Thick Liquid: Not tested   Honey Thick Honey Thick Liquid: Not tested   Puree Puree: Within functional limits Presentation: Self Fed;Spoon   Solid     Solid: Impaired Presentation: Self Fed Oral Phase Impairments: Reduced lingual movement/coordination;Impaired mastication Other Comments: pt hold foods in his mouth on his right side while talking - dried [place] - using milk - pt cleared but concerned he may have aspirated a small amount of milk      [name] 07/31/2018,1:09 PM  [name], [place] [facility] SLP [phone]

## 2018-07-31 NOTE — NC FL2 (Signed)
[place] LEVEL OF CARE SCREENING TOOL     IDENTIFICATION  Patient Name: Mark [name] Birthdate: Feb 26, 1932 Sex: male Admission Date (Current Location): 07/29/2018  [facility] and [place] Number:  Herbalist and Address:  [facility],  [place], [place]      Provider Number: [id number]  Attending Physician Name and Address:  Mariel Aloe, MD  Relative Name and Phone Number:       Current Level of Care: Hospital Recommended Level of Care: [place] Prior Approval Number:    Date Approved/Denied:   PASRR Number:    Discharge Plan: Other (Comment)(Assisted Living Facility)    Current Diagnoses: Patient Active Problem List   Diagnosis Date Noted  . CAP (community acquired pneumonia) 07/29/2018  . Pleural effusion 07/29/2018  . Syncope 03/04/2018  . VT (ventricular tachycardia) ([place]) 03/04/2018    Orientation RESPIRATION BLADDER Height & Weight     Self, Time, Situation, Place  O2(nasal cannula 4L/min) Incontinent Weight: 146 lb 6.2 oz (66.4 kg) Height:  5\' 10"  (177.8 cm)  BEHAVIORAL SYMPTOMS/MOOD NEUROLOGICAL BOWEL NUTRITION STATUS        Diet (see dc Summary)  AMBULATORY STATUS COMMUNICATION OF NEEDS Skin   Limited Assist Verbally Normal                       Personal Care Assistance Level of Assistance  Bathing, Feeding, Dressing Bathing Assistance: Maximum assistance Feeding assistance: Independent Dressing Assistance: Maximum assistance     Functional Limitations Info  Sight, Hearing, Speech Sight Info: Adequate Hearing Info: Adequate Speech Info: Adequate    SPECIAL CARE FACTORS FREQUENCY  PT (By licensed PT)     PT Frequency: HHPT  HHOT            Contractures Contractures Info: Not present    Additional Factors Info  Code Status, Allergies Code Status Info: DNR Allergies Info: NKA              Discharge Medications:  Medication List    STOP taking  these medications   amoxicillin-clavulanate 875-125 MG tablet Commonly known as:  AUGMENTIN     TAKE these medications   acetaminophen 325 MG tablet Commonly known as:  TYLENOL Take 650 mg by mouth every 6 (six) hours as needed for mild pain.   albuterol (2.5 MG/3ML) 0.083% nebulizer solution Commonly known as:  PROVENTIL Take 2.5 mg by nebulization 3 (three) times daily as needed for wheezing or shortness of breath.   ALPRAZolam 0.25 MG tablet Commonly known as:  XANAX Take 1 tablet (0.25 mg total) by mouth 2 (two) times daily as needed for anxiety. What changed:  when to take this   amiodarone 200 MG tablet Commonly known as:  PACERONE Take 1 tablet (200 mg total) by mouth daily.   ASPERCREME W/LIDOCAINE 4 % Crea Generic drug:  Lidocaine HCl Apply 1 application topically See admin instructions. Apply to right knee three times daily as needed for pain   cefdinir 300 MG capsule Commonly known as:  OMNICEF Take 1 capsule (300 mg total) by mouth every 12 (twelve) hours for 8 doses. Start taking on:  08/01/2018   Fluticasone-Salmeterol 250-50 MCG/DOSE Aepb Commonly known as:  ADVAIR Inhale 1 puff into the lungs 2 (two) times daily.   Melatonin 3 MG Tabs Take 3 mg by mouth at bedtime.   metoprolol succinate 25 MG 24 hr tablet Commonly known as:  TOPROL-XL Take 1 tablet (  25 mg total) by mouth daily. Take with or immediately following a meal.   sertraline 50 MG tablet Commonly known as:  ZOLOFT Take 50 mg by mouth daily.   tamsulosin 0.4 MG Caps capsule Commonly known as:  FLOMAX Take 0.4 mg by mouth at bedtime.   traMADol 50 MG tablet Commonly known as:  ULTRAM Take 1 tablet (50 mg total) by mouth every 6 (six) hours as needed (pain).   Vitamin D3 1000 units Caps Take 2,000 Units by mouth 2 (two) times daily.     Relevant Imaging Results:  Relevant Lab Results:   Additional Information SSN [id number]  Burnis Medin, LCSW

## 2018-07-31 NOTE — Evaluation (Signed)
Physical Therapy Evaluation Patient Details Name: Mark [name] MRN: [id number] DOB: 05/04/1932 Today's Date: 07/31/2018   History of Present Illness  84 yo male admitted with Pna. Thoracentesis 8/7. CT imaging showed lung mass. Hx of COPD-O2 dep, pacemaker, VT, syncope, bladder ca    Clinical Impression  On eval, pt required Min assist for mobility. He walked ~15 feet x 2 (to and from bathroom) with a RW. Pt presents with general weakness, decreased activity tolerance, and impaired gait and balance. O2 sat 91% on 4L [place] at rest, 88% on 4L [place] after walk. Discussed d/c plan-pt stated he plans to return to his facility. Per chart, it states he was at [facility]. Will follow and progress activity as tolerated.     Follow Up Recommendations Home health PT(at facility as long as staff can provide current level of care)    Equipment Recommendations  None recommended by PT    Recommendations for Other Services       Precautions / Restrictions Precautions Precautions: Fall Precaution Comments: monitor O2. O2 dep at baseline Restrictions Weight Bearing Restrictions: No      Mobility  Bed Mobility Overal bed mobility: Needs Assistance Bed Mobility: Supine to Sit;Sit to Supine     Supine to sit: Min guard;HOB elevated Sit to supine: Min guard;HOB elevated   General bed mobility comments: close guard for safety, lines. Pt tends to use a bit of mometum for tasks.   Transfers Overall transfer level: Needs assistance Equipment used: Rolling walker (2 wheeled) Transfers: Sit to/from Stand Sit to Stand: From elevated surface;Min assist         General transfer comment: Assist to rise, stabilize, control descent. VCs safety, hand placement. Unsteady.   Ambulation/Gait Ambulation/Gait assistance: Min assist Gait Distance (Feet): 15 Feet(x2) Assistive device: Rolling walker (2 wheeled) Gait Pattern/deviations: Trunk flexed;Step-through pattern     General Gait Details:  Unsteady. Small amount of assist to stabilize. Cues for safety, proper walker use. Remained on  O2-4L.   Stairs            Wheelchair Mobility    Modified Rankin (Stroke Patients Only)       Balance Overall balance assessment: Needs assistance         Standing balance support: Bilateral upper extremity supported Standing balance-Leahy Scale: Poor                               Pertinent Vitals/Pain Pain Assessment: No/denies pain    Home Living Family/patient expects to be discharged to:: Unsure               Home Equipment: Gilford Rile - 2 wheels      Prior Function Level of Independence: Needs assistance   Gait / Transfers Assistance Needed: uses walker for ambulation  ADL's / Homemaking Assistance Needed: staff assists with "pretty much everything" per pt        Hand Dominance        Extremity/Trunk Assessment   Upper Extremity Assessment Upper Extremity Assessment: Generalized weakness    Lower Extremity Assessment Lower Extremity Assessment: Generalized weakness    Cervical / Trunk Assessment Cervical / Trunk Assessment: Kyphotic  Communication   Communication: Expressive difficulties(speech is difficult to understand at times)  Cognition Arousal/Alertness: Awake/alert Behavior During Therapy: WFL for tasks assessed/performed Overall Cognitive Status: Within Functional Limits for tasks assessed  General Comments      Exercises     Assessment/Plan    PT Assessment Patient needs continued PT services  PT Problem List Decreased strength;Decreased mobility;Decreased balance;Decreased activity tolerance;Decreased knowledge of use of DME       PT Treatment Interventions DME instruction;Gait training;Therapeutic activities;Therapeutic exercise;Patient/family education;Balance training;Functional mobility training    PT Goals (Current goals can be found in the Care Plan  section)  Acute Rehab PT Goals Patient Stated Goal: back to facility PT Goal Formulation: With patient Time For Goal Achievement: 08/14/18 Potential to Achieve Goals: Fair    Frequency Min 3X/week   Barriers to discharge        Co-evaluation               AM-PAC PT "6 Clicks" Daily Activity  Outcome Measure Difficulty turning over in bed (including adjusting bedclothes, sheets and blankets)?: A Little Difficulty moving from lying on back to sitting on the side of the bed? : A Little Difficulty sitting down on and standing up from a chair with arms (e.g., wheelchair, bedside commode, etc,.)?: Unable Help needed moving to and from a bed to chair (including a wheelchair)?: A Little Help needed walking in hospital room?: A Little Help needed climbing 3-5 steps with a railing? : A Lot 6 Click Score: 15    End of Session Equipment Utilized During Treatment: Gait belt Activity Tolerance: Patient tolerated treatment well Patient left: in bed;with call bell/phone within reach;with bed alarm set   PT Visit Diagnosis: Muscle weakness (generalized) (M62.81);Difficulty in walking, not elsewhere classified (R26.2);Unsteadiness on feet (R26.81)    Time: 9518-8416 PT Time Calculation (min) (ACUTE ONLY): 18 min   Charges:   PT Evaluation $PT Eval Moderate Complexity: 1 Mod            Weston Anna, MPT Pager: [phone]

## 2018-07-31 NOTE — Progress Notes (Signed)
PMT progress note  Call placed and discussed with son Jontue Crumpacker. Patient used to live near the [place], has been living in [place] for the past few months.   We reviewed the patient's current condition, and the scope of this hospitalization.   Son Devere Brem is an only child, he has had shoulder surgery recently.   We discussed that the patient has been admitted with L pleural effusion, he has undergone thoracentesis. Cytology results are pending.   Patient has been diagnosed with lung masses.   Discussed with son about next steps, patient to go back to [facility], recommend palliative care follow up with him over there. Son is asking about how to manage the patient's care, should he have re accumulation of his effusion, we discussed about that in detail. All of the son's concerns addressed to the best of my ability.   Patient's wife died 2 years ago.   Coordination of care discussed, patient has an appointment with his cardiologist for pacemaker check on August 22, 2018, which he was encouraged to keep.   Patient's son would like to be notified of cytology results from thoracentesis and also wants to know detail about upcoming med oncology visit.   All of his questions addressed to the best of my ability, also discussed with Dr Lonny Prude.   Patient to be discharged today  Loistine Chance MD [facility] health palliative medicine team [phone]

## 2018-07-31 NOTE — Discharge Summary (Signed)
Physician Discharge Summary  Mark [name] YPP:[id number] DOB: [birth year] DOA: 07/29/2018  PCP: Wenda Low, MD  Admit date: 07/29/2018 Discharge date: 07/31/2018  Admitted From: ALF Disposition: ALF  Recommendations for Outpatient Follow-up:  1. Follow up with PCP in 1 week 2. Follow up with oncology (information below) per goals of care 3. May need repeat thoracenteses for comfort 4. Please follow up on the following pending results: Body fluid (pleural fluid) cytology, sputum culture, blood culture 5. Recommend palliative care referral  Home Health: PT/OT Equipment/Devices: Oxygen (4 L)  Discharge Condition: Stable CODE STATUS: DNR Diet recommendation: Dysphagia 3 (Mech soft);Thin liquid   Liquid Administration via: Cup;Straw Medication Administration: Whole meds with puree Supervision: Patient able to self feed Compensations: Slow rate;Small sips/bites;Follow solids with liquid Postural Changes: Seated upright at 90 degrees   Brief/Interim Summary:  Admission HPI written by A Melven Sartorius., MD   Chief Complaint: SOB  HPI: Mark [name] is a 84 y.o. male with medical history significant of COPD, depression, coronary artery disease, status post pacemaker placement, hyperlipidemia, bladder cancer presenting with increased shortness of breath and oxygen requirement with chest x-ray findings at SNF concerning for pneumonia with a pleural effusion.  He notes paroxysmal nocturnal dyspnea and orthopnea.  On discussion with the skilled nursing facility, they say that he has been on 2 L by nasal cannula for a little while, but recently his oxygen requirement has increased.  He denies any fevers or chills.  He denies any abdominal pain or nausea and vomiting.  He denies any swelling in his legs.  He does have a history of smoking.  ED Course: Labs, EKG, CXR, chest CT.  Admit for thoracentesis.    Hospital course:  Community acquired pneumonia Empirically treated with  ceftriaxone and azithromycin. Continue treatment on discharge. Blood cultures no growth to date. Cefdinir on discharge.  Left sided pleural effusion Patient had thoracentesis yielding 2L of blood tinged fluid. Likely exudate. Concern for malignant pleural effusion. Cytology pending. Palliative care consulted and family more leaning towards comfort care. I have included follow-up for oncology if so pursued by the family and patient.  Acute on chronic respiratory failure with hypoxia Acutely worsened secondary to pneumonia and pleural effusion. Discharge on 4 L oxygen, titrate as needed to keep oxygen greater than 90%  Lung masses Concern for malignancy. Focus is comfort. Palliative care consulted on admission.  Orthopnea Secondary to pleural effusion.  COPD Continued albuterol and Dulera  Leukocytosis Stable. In setting of pneumonia  History of bladder cancer S/p chemo and radiation.  Hyperkalemia Resolved.  Atrial fibrillation Continued metoprolol and amiodarone  Anxiety Decreased Xanax to BID prn from q6 hours prn.  Discharge Diagnoses:  Active Problems:   CAP (community acquired pneumonia)   Pleural effusion    Discharge Instructions   Allergies as of 07/31/2018   No Known Allergies     Medication List    STOP taking these medications   amoxicillin-clavulanate 875-125 MG tablet Commonly known as:  AUGMENTIN     TAKE these medications   acetaminophen 325 MG tablet Commonly known as:  TYLENOL Take 650 mg by mouth every 6 (six) hours as needed for mild pain.   albuterol (2.5 MG/3ML) 0.083% nebulizer solution Commonly known as:  PROVENTIL Take 2.5 mg by nebulization 3 (three) times daily as needed for wheezing or shortness of breath.   ALPRAZolam 0.25 MG tablet Commonly known as:  XANAX Take 1 tablet (0.25 mg total) by mouth 2 (two) times  daily as needed for anxiety. What changed:  when to take this   amiodarone 200 MG tablet Commonly known as:   PACERONE Take 1 tablet (200 mg total) by mouth daily.   ASPERCREME W/LIDOCAINE 4 % Crea Generic drug:  Lidocaine HCl Apply 1 application topically See admin instructions. Apply to right knee three times daily as needed for pain   cefdinir 300 MG capsule Commonly known as:  OMNICEF Take 1 capsule (300 mg total) by mouth every 12 (twelve) hours for 8 doses. Start taking on:  08/01/2018   Fluticasone-Salmeterol 250-50 MCG/DOSE Aepb Commonly known as:  ADVAIR Inhale 1 puff into the lungs 2 (two) times daily.   Melatonin 3 MG Tabs Take 3 mg by mouth at bedtime.   metoprolol succinate 25 MG 24 hr tablet Commonly known as:  TOPROL-XL Take 1 tablet (25 mg total) by mouth daily. Take with or immediately following a meal.   sertraline 50 MG tablet Commonly known as:  ZOLOFT Take 50 mg by mouth daily.   tamsulosin 0.4 MG Caps capsule Commonly known as:  FLOMAX Take 0.4 mg by mouth at bedtime.   traMADol 50 MG tablet Commonly known as:  ULTRAM Take 1 tablet (50 mg total) by mouth every 6 (six) hours as needed (pain).   Vitamin D3 1000 units Caps Take 2,000 Units by mouth 2 (two) times daily.      Follow-up Information    Wenda Low, MD. Schedule an appointment as soon as possible for a visit in 1 week(s).   Specialty:  Internal Medicine Contact information: [address] [place] [address] [phone]        Nicholas Lose, MD Follow up.   Specialty:  Hematology and Oncology Why:  If needed for follow-up on lung  Contact information: [place] [address] [phone]          No Known Allergies  Consultations:  Palliative care   Procedures/Studies: Dg Chest 1 View  Result Date: 07/30/2018 CLINICAL DATA:  Post thoracentesis. EXAM: CHEST  1 VIEW COMPARISON:  07/29/2018. FINDINGS: Significant decrease in LEFT pleural effusion post thoracentesis. A large amount of fluid remains. There is compressive atelectasis  at the LEFT base. There is no pneumothorax. Cardiomegaly with dual lead pacer. RIGHT hemithorax is clear. IMPRESSION: Significant decrease in LEFT pleural effusion post thoracentesis. No pneumothorax. Electronically Signed   By: Staci Righter M.D.   On: 07/30/2018 16:15   Dg Chest 2 View  Result Date: 07/29/2018 CLINICAL DATA:  Family states he was brought in to have "fluid drained from his lung" and had sob increasing since yesterday. EXAM: CHEST - 2 VIEW COMPARISON:  None. FINDINGS: Patient has a LEFT-sided transvenous pacemaker with leads to the RIGHT atrium and RIGHT ventricle. The heart margins are obscured by significant LEFT lung opacity. There is atherosclerotic calcification of the thoracic aorta. Opacity spares only the apex and is consistent with pleural effusion and atelectasis or infiltrate. The RIGHT lung is clear. Previous vertebral augmentation. IMPRESSION: Significant opacity in the LEFT hemithorax, consistent with pleural effusion and atelectasis or infiltrate. Aortic atherosclerosis.  (ICD10-I70.0) Electronically Signed   By: Nolon Nations M.D.   On: 07/29/2018 13:08   Ct Chest W Contrast  Result Date: 07/29/2018 CLINICAL DATA:  Shortness of breath. EXAM: CT CHEST WITH CONTRAST TECHNIQUE: Multidetector CT imaging of the chest was performed during intravenous contrast administration. CONTRAST:  50mL OMNIPAQUE IOHEXOL 300 MG/ML  SOLN COMPARISON:  Radiographs of July 29, 2018. FINDINGS: Cardiovascular: Atherosclerosis of thoracic aorta is noted without aneurysm formation. Coronary artery calcifications are noted. Normal cardiac size. No pericardial effusion. Mediastinum/Nodes: Thyroid gland and esophagus are unremarkable. No significant adenopathy is noted. Lungs/Pleura: 10.8 x 10.3 x 5.0 cm probable mass is seen medially in left upper lobe which is resulting in atelectasis of left upper lobe. Several smaller masses are noted in the collapsed left lung concerning for metastatic disease. The  largest measures 4.1 x 3.1 cm. Soft tissue material is noted in the left mainstem bronchus and its branches consistent with aspirated material or mucous plugging. Large left pleural effusion is noted with atelectasis of the left lower lobe and significant atelectasis of left upper lobe as previously described. Multiple rounded densities are noted in the right lung consistent with metastatic disease. The largest measures 2.4 cm in right middle lobe. Some degree of emphysematous disease is noted in the right upper lobe. Upper Abdomen: No acute abnormality. Musculoskeletal: No chest wall abnormality. No acute or significant osseous findings. IMPRESSION: Large left pleural effusion is noted with complete atelectasis of the left lower lobe and significant atelectasis of the left upper lobe. 10.8 x 10.3 x 5 cm mass is noted medially in the left upper lobe consistent with malignancy. Probable mucus plugging or aspirated material is noted in the left mainstem bronchus. Multiple pulmonary masses are noted in both lungs consistent with metastatic disease. Further evaluation with PET scan and bronchoscopy is recommended. Coronary artery calcifications are noted suggesting coronary artery disease. Aortic Atherosclerosis (ICD10-I70.0) and Emphysema (ICD10-J43.9). Electronically Signed   By: Marijo Conception, M.D.   On: 07/29/2018 16:09   US Thoracentesis Asp Pleural Space W/img Guide  Result Date: 07/30/2018 INDICATION: Patient with history of bladder cancer, COPD, left pleural effusion, left lung mass. Request made for diagnostic and therapeutic left thoracentesis. EXAM: ULTRASOUND GUIDED DIAGNOSTIC AND THERAPEUTIC LEFT THORACENTESIS MEDICATIONS: None COMPLICATIONS: None immediate. PROCEDURE: An ultrasound guided thoracentesis was thoroughly discussed with the patient and questions answered. The benefits, risks, alternatives and complications were also discussed. The patient understands and wishes to proceed with the  procedure. Written consent was obtained. Ultrasound was performed to localize and mark an adequate pocket of fluid in the left chest. The area was then prepped and draped in the normal sterile fashion. 1% Lidocaine was used for local anesthesia. Under ultrasound guidance a 6 Fr Safe-T-Centesis catheter was introduced. Thoracentesis was performed. The catheter was removed and a dressing applied. FINDINGS: A total of approximately 2 liters of blood-tinged fluid was removed. Samples were sent to the laboratory as requested by the clinical team. IMPRESSION: Successful ultrasound guided diagnostic and therapeutic left thoracentesis yielding 2 liters of pleural fluid. Read by: Rowe Robert, PA-C No pneumothorax on follow-up radiograph. Electronically Signed   By: Lucrezia Europe M.D.   On: 07/30/2018 16:17     Subjective: Feels much better  Discharge Exam: Vitals:   07/31/18 1313 07/31/18 1355  BP: 128/63   Pulse: 70 70  Resp: 20 18  Temp: 97.7 F (36.5 C)   SpO2: 97% 90%   Vitals:   07/31/18 0853 07/31/18 0914 07/31/18 1313 07/31/18 1355  BP:  (!) 113/50 128/63   Pulse: 72 70 70 70  Resp: 18 16 20 18   Temp:   97.7 F (36.5 C)   TempSrc:   Oral   SpO2: 98% 95% 97% 90%  Weight:      Height:        General: Pt is alert, awake, not  in acute distress Cardiovascular: RRR, S1/S2 +, no rubs, no gallops Respiratory: Decreased breath sounds on right. no wheezing, no rhonchi Abdominal: Soft, NT, ND, bowel sounds + Extremities: no edema, no cyanosis    The results of significant diagnostics from this hospitalization (including imaging, microbiology, ancillary and laboratory) are listed below for reference.     Microbiology: Recent Results (from the past 240 hour(s))  Culture, blood (routine x 2)     Status: None (Preliminary result)   Collection Time: 07/29/18  3:19 PM  Result Value Ref Range Status   Specimen Description   Final    BLOOD LEFT WRIST Performed at [facility], [place] [address] [place], [place] [address]    Special Requests   Final    BOTTLES DRAWN AEROBIC AND ANAEROBIC Blood Culture adequate volume Performed at [place] [facility] [address], [place], [place] [address]    Culture   Final    NO GROWTH 2 DAYS Performed at [place] [address] [place], [place] [address]    Report Status PENDING  Incomplete  Culture, blood (routine x 2)     Status: None (Preliminary result)   Collection Time: 07/29/18  3:23 PM  Result Value Ref Range Status   Specimen Description   Final    BLOOD LEFT ARM Performed at [place] [address], [place], [place] [address]    Special Requests   Final    BOTTLES DRAWN AEROBIC AND ANAEROBIC Blood Culture adequate volume Performed at [facility] [address] [place], [place] [address]    Culture   Final    NO GROWTH 2 DAYS Performed at [facility] [address] [place], [place] [address]    Report Status PENDING  Incomplete  MRSA PCR Screening     Status: None   Collection Time: 07/29/18 11:40 PM  Result Value Ref Range Status   MRSA by PCR NEGATIVE NEGATIVE Final    Comment:        The GeneXpert MRSA Assay (FDA approved for NASAL specimens only), is one component of a comprehensive MRSA colonization surveillance program. It is not intended to diagnose MRSA infection nor to guide or monitor treatment for MRSA infections. Performed at [facility], [place] [address], [place], [place] [address]   Culture, sputum-assessment     Status: None   Collection Time: 07/30/18  7:31 AM  Result Value Ref Range Status   Specimen Description SPUTUM  Final   Special Requests NONE  Final   Sputum evaluation   Final    THIS SPECIMEN IS ACCEPTABLE FOR SPUTUM CULTURE Performed at [facility], [place] [address], [place], [place] [address]    Report Status 07/30/2018 FINAL  Final    Culture, respiratory     Status: None (Preliminary result)   Collection Time: 07/30/18  7:31 AM  Result Value Ref Range Status   Specimen Description   Final    SPUTUM Performed at [place] [address], [place], [place] [address]    Special Requests   Final    NONE Reflexed from [id number] Performed at [facility], [place] [address], [place], [place] [address]    Gram Stain   Final    RARE WBC PRESENT, PREDOMINANTLY PMN RARE GRAM VARIABLE ROD    Culture   Final    CULTURE REINCUBATED FOR BETTER GROWTH Performed at [facility], [address] [place], [place] [address]    Report Status PENDING  Incomplete  Culture, body fluid-bottle     Status: None (Preliminary result)   Collection Time: 07/30/18  3:38 PM  Result Value Ref Range Status   Specimen Description FLUID PLEURAL LEFT  Final   Special Requests BOTTLES DRAWN AEROBIC AND ANAEROBIC  Final   Gram Stain PENDING  Incomplete   Culture PENDING  Incomplete   Report Status PENDING  Incomplete  Gram stain     Status: None   Collection Time: 07/30/18  3:38 PM  Result Value Ref Range Status   Specimen Description FLUID PLEURAL LEFT  Final   Special Requests NONE  Final   Gram Stain   Final    RARE WBC PRESENT, PREDOMINANTLY MONONUCLEAR NO ORGANISMS SEEN Performed at [facility], [place] [address], [place], [place] [address]    Report Status 07/30/2018 FINAL  Final     Labs: BNP (last 3 results) Recent Labs    07/29/18 1326  BNP 176.1*   Basic Metabolic Panel: Recent Labs  Lab 07/29/18 1326 07/30/18 0541 07/31/18 0507  NA 142 138 139  K 4.6 5.2* 4.7  CL 103 100 101  CO2 32 32 30  GLUCOSE 115* 103* 104*  BUN 17 18 23   CREATININE 0.83 0.81 0.89  CALCIUM 8.4* 8.7* 8.7*   Liver Function Tests: Recent Labs  Lab 07/30/18 0541  AST 15  ALT 10  ALKPHOS 87  BILITOT 0.5  PROT 6.0*  ALBUMIN 3.1*   No results for input(s): LIPASE, AMYLASE in the last 168  hours. No results for input(s): AMMONIA in the last 168 hours. CBC: Recent Labs  Lab 07/29/18 1326 07/30/18 0541 07/31/18 0507  WBC 12.4* 11.0* 12.1*  NEUTROABS 10.6*  --   --   HGB 11.1* 11.1* 10.8*  HCT 36.3* 36.8* 35.3*  MCV 96.8 97.9 96.2  PLT 276 282 274   Cardiac Enzymes: No results for input(s): CKTOTAL, CKMB, CKMBINDEX, TROPONINI in the last 168 hours. BNP: Invalid input(s): POCBNP CBG: No results for input(s): GLUCAP in the last 168 hours. D-Dimer No results for input(s): DDIMER in the last 72 hours. Hgb A1c No results for input(s): HGBA1C in the last 72 hours. Lipid Profile No results for input(s): CHOL, HDL, LDLCALC, TRIG, CHOLHDL, LDLDIRECT in the last 72 hours. Thyroid function studies No results for input(s): TSH, T4TOTAL, T3FREE, THYROIDAB in the last 72 hours.  Invalid input(s): FREET3 Anemia work up No results for input(s): VITAMINB12, FOLATE, FERRITIN, TIBC, IRON, RETICCTPCT in the last 72 hours. Urinalysis No results found for: COLORURINE, APPEARANCEUR, [place], [place], [place], [place], [place], [place], PROTEINUR, UROBILINOGEN, NITRITE, LEUKOCYTESUR Sepsis Labs Invalid input(s): PROCALCITONIN,  WBC,  LACTICIDVEN Microbiology Recent Results (from the past 240 hour(s))  Culture, blood (routine x 2)     Status: None (Preliminary result)   Collection Time: 07/29/18  3:19 PM  Result Value Ref Range Status   Specimen Description   Final    BLOOD LEFT WRIST Performed at [facility], [place] [address] [place], [place] [address]    Special Requests   Final    BOTTLES DRAWN AEROBIC AND ANAEROBIC Blood Culture adequate volume Performed at [place] [address], [place], [place] [address]    Culture   Final    NO GROWTH 2 DAYS Performed at [place] [address] [place], [place] [address]    Report Status PENDING  Incomplete  Culture, blood (routine x 2)     Status: None (Preliminary result)    Collection Time: 07/29/18  3:23 PM  Result Value Ref Range Status   Specimen Description   Final    BLOOD LEFT ARM Performed at [place] [address] [place], [place] [address]    Special Requests   Final    BOTTLES DRAWN AEROBIC AND ANAEROBIC Blood Culture adequate volume Performed at [place] [address] [place], [place] [address]    Culture   Final    NO GROWTH 2 DAYS Performed at [place] [address] [place], [place] [address]    Report Status PENDING  Incomplete  MRSA PCR Screening     Status: None   Collection Time: 07/29/18 11:40 PM  Result Value Ref Range Status   MRSA by PCR NEGATIVE NEGATIVE Final    Comment:        The GeneXpert MRSA Assay (FDA approved for NASAL specimens only), is one component of a comprehensive MRSA colonization surveillance program. It is not intended to diagnose MRSA infection nor to guide or monitor treatment for MRSA infections. Performed at [facility], [place] [address], [place], [place] [address]   Culture, sputum-assessment     Status: None   Collection Time: 07/30/18  7:31 AM  Result Value Ref Range Status   Specimen Description SPUTUM  Final   Special Requests NONE  Final   Sputum evaluation   Final    THIS SPECIMEN IS ACCEPTABLE FOR SPUTUM CULTURE Performed at [facility], [place] [address], [place], [place] [address]    Report Status 07/30/2018 FINAL  Final  Culture, respiratory     Status: None (Preliminary result)   Collection Time: 07/30/18  7:31 AM  Result Value Ref Range Status   Specimen Description   Final    SPUTUM Performed at [place] [address] [place], [place] [address]    Special Requests   Final    NONE Reflexed from [id number] Performed at [facility], [place] [address] [place], [place] [address]    Gram Stain   Final    RARE WBC PRESENT, PREDOMINANTLY  PMN RARE GRAM VARIABLE ROD    Culture   Final    CULTURE REINCUBATED FOR BETTER GROWTH Performed at [facility], [place] [address] [place], [place] [address]    Report Status PENDING  Incomplete  Culture, body fluid-bottle     Status: None (Preliminary result)   Collection Time: 07/30/18  3:38 PM  Result Value Ref Range Status   Specimen Description FLUID PLEURAL LEFT  Final   Special Requests BOTTLES DRAWN AEROBIC AND ANAEROBIC  Final   Gram Stain PENDING  Incomplete   Culture PENDING  Incomplete   Report Status PENDING  Incomplete  Gram stain     Status: None   Collection Time: 07/30/18  3:38 PM  Result Value Ref Range Status   Specimen Description FLUID PLEURAL LEFT  Final   Special Requests NONE  Final   Gram Stain   Final    RARE WBC PRESENT, PREDOMINANTLY MONONUCLEAR NO ORGANISMS SEEN Performed at [facility], [place] [address] [place],  [address]    Report Status 07/30/2018 FINAL  Final     SIGNED:   Cordelia Poche, MD [facility] 07/31/2018, 2:15 PM

## 2018-08-01 LAB — CULTURE, RESPIRATORY W GRAM STAIN: Culture: NORMAL

## 2018-08-01 LAB — CULTURE, RESPIRATORY

## 2018-08-02 NOTE — ED Provider Notes (Signed)
[facility] Provider Note   CSN: [id number] Arrival date & time: 07/29/18  1047     History   Chief Complaint Chief Complaint  Patient presents with  . Shortness of Breath    HPI Mark [name] is a 84 y.o. male.  HPI Patient presents to the emergency department with shortness of breath that started several days ago.  The patient had an x-ray done yesterday showed a possible pneumonia versus fluid.  The patient states that he is not normally on oxygen but required oxygen at the facility.  Patient states that he does have a history of COPD.  The patient states that he has not had any wheezing that he knows of.  Patient initially had a pulse oximetry reading of 82% at the nursing facility.  The patient denies chest pain,  headache,blurred vision, neck pain, fever, cough, weakness, numbness, dizziness, anorexia, edema, abdominal pain, nausea, vomiting, diarrhea, rash, back pain, dysuria, hematemesis, bloody stool, near syncope, or syncope. Past Medical History:  Diagnosis Date  . 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 Cardiac pacemaker   . COPD (chronic obstructive pulmonary disease) ([place])   . Hyperlipidemia   . Insomnia   . Major depressive disorder   . Malignant neoplasm of bladder ([place])   . Syncope 03/04/2018  . VT (ventricular tachycardia) ([place]) 03/04/2018    Patient Active Problem List   Diagnosis Date Noted  . CAP (community acquired pneumonia) 07/29/2018  . Pleural effusion 07/29/2018  . Syncope 03/04/2018  . VT (ventricular tachycardia) ([place]) 03/04/2018    Past Surgical History:  Procedure Laterality Date  . PACEMAKER INSERTION  02/20108   in [place], [place] Medications    Prior to Admission medications   Medication Sig Start Date End Date Taking? Authorizing Provider  acetaminophen (TYLENOL) 325 MG tablet Take 650 mg by mouth every 6 (six) hours as needed for mild pain.     Yes [provider]  albuterol (PROVENTIL) (2.5 MG/3ML) 0.083% nebulizer solution Take 2.5 mg by nebulization 3 (three) times daily as needed for wheezing or shortness of breath.    Yes [provider]  amiodarone (PACERONE) 200 MG tablet Take 1 tablet (200 mg total) by mouth daily. 04/11/18  Yes Barrett, Evelene Croon, PA-C  Cholecalciferol (VITAMIN D3) 1000 units CAPS Take 2,000 Units by mouth 2 (two) times daily.   Yes [provider]  Fluticasone-Salmeterol (ADVAIR) 250-50 MCG/DOSE AEPB Inhale 1 puff into the lungs 2 (two) times daily.    Yes [provider]  Lidocaine HCl (ASPERCREME W/LIDOCAINE) 4 % CREA Apply 1 application topically See admin instructions. Apply to right knee three times daily as needed for pain   Yes [provider]  Melatonin 3 MG TABS Take 3 mg by mouth at bedtime. 07/28/18  Yes [provider]  metoprolol succinate (TOPROL-XL) 25 MG 24 hr tablet Take 1 tablet (25 mg total) by mouth daily. Take with or immediately following a meal. 04/11/18  Yes Barrett, Evelene Croon, PA-C  sertraline (ZOLOFT) 50 MG tablet Take 50 mg by mouth daily.    Yes [provider]  tamsulosin (FLOMAX) 0.4 MG CAPS capsule Take 0.4 mg by mouth at bedtime.    Yes [provider]  ALPRAZolam (XANAX) 0.25 MG tablet Take 1 tablet (0.25 mg total) by mouth 2 (two) times daily as needed for anxiety. 07/31/18   Mariel Aloe,  MD  cefdinir (OMNICEF) 300 MG capsule Take 1 capsule (300 mg total) by mouth every 12 (twelve) hours for 8 doses. 08/01/18 08/05/18  Mariel Aloe, MD  traMADol (ULTRAM) 50 MG tablet Take 1 tablet (50 mg total) by mouth every 6 (six) hours as needed (pain). 07/31/18   Mariel Aloe, MD    Family History Family History  Problem Relation Age of Onset  . CAD Neg Hx     Social History Social History   Tobacco Use  . Smoking status: Former Smoker    Types: Cigarettes    Last attempt to quit: 11/23/2016    Years since  quitting: 1.6  . Smokeless tobacco: Never Used  Substance Use Topics  . Alcohol use: Never    Frequency: Never  . Drug use: Never     Allergies   Patient has no known allergies.   Review of Systems Review of Systems All other systems negative except as documented in the HPI. All pertinent positives and negatives as reviewed in the HPI. Physical Exam Updated Vital Signs BP 128/63 (BP Location: Right Arm)   Pulse 70   Temp 97.7 F (36.5 C) (Oral)   Resp 18   Ht 5\' 10"  (1.778 m)   Wt 66.4 kg   SpO2 90%   BMI 21.00 kg/m   Physical Exam  Constitutional: He is oriented to person, place, and time. He appears well-developed and well-nourished. No distress.  HENT:  Head: Normocephalic and atraumatic.  Mouth/Throat: Oropharynx is clear and moist.  Eyes: Pupils are equal, round, and reactive to light.  Neck: Normal range of motion. Neck supple.  Cardiovascular: Normal rate, regular rhythm and normal heart sounds. Exam reveals no gallop and no friction rub.  No murmur heard. Pulmonary/Chest: Effort normal. No respiratory distress. He has decreased breath sounds in the left middle field and the left lower field. He has no wheezes.  Abdominal: Soft. Bowel sounds are normal. He exhibits no distension. There is no tenderness.  Neurological: He is alert and oriented to person, place, and time. He exhibits normal muscle tone. Coordination normal.  Skin: Skin is warm and dry. Capillary refill takes less than 2 seconds. No rash noted. No erythema.  Psychiatric: He has a normal mood and affect. His behavior is normal.  Nursing note and vitals reviewed.    ED Treatments / Results  Labs (all labs ordered are listed, but only abnormal results are displayed) Labs Reviewed  BASIC METABOLIC PANEL - Abnormal; Notable for the following components:      Result Value   Glucose, Bld 115 (*)    Calcium 8.4 (*)    All other components within normal limits  CBC WITH DIFFERENTIAL/PLATELET -  Abnormal; Notable for the following components:   WBC 12.4 (*)    RBC 3.75 (*)    Hemoglobin 11.1 (*)    HCT 36.3 (*)    Neutro Abs 10.6 (*)    All other components within normal limits  COMPREHENSIVE METABOLIC PANEL - Abnormal; Notable for the following components:   Potassium 5.2 (*)    Glucose, Bld 103 (*)    Calcium 8.7 (*)    Total Protein 6.0 (*)    Albumin 3.1 (*)    All other components within normal limits  CBC - Abnormal; Notable for the following components:   WBC 11.0 (*)    RBC 3.76 (*)    Hemoglobin 11.1 (*)    HCT 36.8 (*)    All other components  within normal limits  BRAIN NATRIURETIC PEPTIDE - Abnormal; Notable for the following components:   B Natriuretic Peptide 162.1 (*)    All other components within normal limits  LACTATE DEHYDROGENASE, PLEURAL OR PERITONEAL FLUID - Abnormal; Notable for the following components:   LD, Fluid 447 (*)    All other components within normal limits  BODY FLUID CELL COUNT WITH DIFFERENTIAL - Abnormal; Notable for the following components:   Color, Fluid RED (*)    Appearance, Fluid TURBID (*)    Monocyte-Macrophage-Serous Fluid 21 (*)    All other components within normal limits  BASIC METABOLIC PANEL - Abnormal; Notable for the following components:   Glucose, Bld 104 (*)    Calcium 8.7 (*)    All other components within normal limits  CBC - Abnormal; Notable for the following components:   WBC 12.1 (*)    RBC 3.67 (*)    Hemoglobin 10.8 (*)    HCT 35.3 (*)    All other components within normal limits  CULTURE, BLOOD (ROUTINE X 2)  CULTURE, BLOOD (ROUTINE X 2)  EXPECTORATED SPUTUM ASSESSMENT W REFEX TO RESP CULTURE  MRSA PCR SCREENING  CULTURE, RESPIRATORY  CULTURE, BODY FLUID-BOTTLE  GRAM STAIN  HIV ANTIBODY (ROUTINE TESTING)  STREP PNEUMONIAE URINARY ANTIGEN  PROTIME-INR  ALBUMIN, PLEURAL OR PERITONEAL FLUID  PROTEIN, PLEURAL OR PERITONEAL FLUID  GLUCOSE, PLEURAL OR PERITONEAL FLUID  PH, BODY FLUID  CYTOLOGY  - NON PAP    EKG EKG Interpretation  Date/Time:  Tuesday July 29 2018 11:13:46 EDT Ventricular Rate:  70 PR Interval:    QRS Duration: 92 QT Interval:  453 QTC Calculation: 489 R Axis:   32 Text Interpretation:  Atrial-paced complexes Probable anterior infarct, old Confirmed by Veryl Speak [phone]) on 07/31/2018 7:56:59 AM   Radiology No results found.  Procedures Procedures (including critical care time)  Medications Ordered in ED Medications  lidocaine (XYLOCAINE) 1 % (with pres) injection (has no administration in time range)  albuterol (PROVENTIL) (2.5 MG/3ML) 0.083% nebulizer solution 5 mg (5 mg Nebulization Given 07/29/18 1200)  iohexol (OMNIPAQUE) 300 MG/ML solution 75 mL (75 mLs Intravenous Contrast Given 07/29/18 1541)  cefTRIAXone (ROCEPHIN) 1 g in sodium chloride 0.9 % 100 mL IVPB (1 g Intravenous New Bag/Given 07/31/18 1448)  azithromycin (ZITHROMAX) tablet 500 mg (500 mg Oral Given 07/31/18 1601)     Initial Impression / Assessment and Plan / ED Course  I have reviewed the triage vital signs and the nursing notes.  Pertinent labs & imaging results that were available during my care of the patient were reviewed by me and considered in my medical decision making (see chart for details).     She has a pleural effusion noted on the left that is fairly large.  The patient will need to be admitted and have further work-up and evaluation of this.  Spoke with the [facility] who will admit the patient for further care and evaluation.  Final Clinical Impressions(s) / ED Diagnoses   Final diagnoses:  Pleural effusion    ED Discharge Orders         Ordered    cefdinir (OMNICEF) 300 MG capsule  Every 12 hours     07/31/18 1414    traMADol (ULTRAM) 50 MG tablet  Every 6 hours PRN     07/31/18 1414    ALPRAZolam (XANAX) 0.25 MG tablet  2 times daily PRN     07/31/18 1414  Dalia Heading, PA-C 08/02/18 1557    Virgel Manifold, MD 08/13/18  [phone]

## 2018-08-03 LAB — CULTURE, BLOOD (ROUTINE X 2)
CULTURE: NO GROWTH
Culture: NO GROWTH
SPECIAL REQUESTS: ADEQUATE
Special Requests: ADEQUATE

## 2018-08-04 ENCOUNTER — Telehealth: Payer: Self-pay | Admitting: *Deleted

## 2018-08-04 NOTE — Telephone Encounter (Signed)
Oncology Nurse Navigator Documentation  Oncology Nurse Navigator Flowsheets 08/04/2018  Navigator Location [facility]  Referral date to RadOnc/MedOnc 08/04/2018  Navigator Encounter Type Telephone/I received referral on Mark [name]. His recent cytology was non-diagnostic.  I looked at medical notes in EMR and palliative care saw patient while in the hospital.  I called the son due to being unclear of referral on next steps.  I spoke with Mark [name] patient's POA.  Apparently, patient was treated for bladder cancer and was told in February that there were no treatment options.  I listened as he explained.  I asked if he would like to see oncology or Hospice.  He states his dad is ready "to go to heaven".  I asked that he call PCP or facility doctor with an updated and need to refer to Hospice.  He verbalized understanding.   Telephone Outgoing Call  Treatment Phase Abnormal Scans  Barriers/Navigation Needs Education  Education Other  Interventions Education  Acuity Level 2  Time Spent with Patient 30

## 2018-08-05 LAB — CULTURE, BODY FLUID W GRAM STAIN -BOTTLE: Culture: NO GROWTH

## 2018-08-06 ENCOUNTER — Encounter: Payer: Self-pay | Admitting: *Deleted

## 2018-08-06 NOTE — Progress Notes (Signed)
Oncology Nurse Navigator Documentation  Oncology Nurse Navigator Flowsheets 08/06/2018  Navigator Location [facility]  Navigator Encounter Type Other/I called [facility] facility to see if Hospice referral has been made.  I spoke to nurse supervisor and stated this was completed Friday 08/01/18.    Treatment Phase Abnormal Scans  Barriers/Navigation Needs Coordination of Care  Interventions Coordination of Care  Coordination of Care Other  Acuity Level 2  Time Spent with Patient 15

## 2018-08-22 ENCOUNTER — Ambulatory Visit: Payer: Medicare Other | Admitting: Cardiovascular Disease

## 2018-08-24 DEATH — deceased

## 2018-08-26 IMAGING — CT CT CHEST W/ CM
2 of 4 series · 15 of 36 positions shown, 18 images · IV contrast (omnipaque)
Comparison: Radiographs July 29, 2018.

CLINICAL DATA: Shortness of breath.

EXAM:
CT CHEST WITH CONTRAST
TECHNIQUE: Multidetector CT imaging of the chest was performed during
intravenous contrast administration.
CONTRAST:  75mL OMNIPAQUE IOHEXOL 300 MG/ML  SOLN

[Series 2: thorax · axial · 0.75mm/px · z∈[+1220,+1502]mm · 12 of 165 slices shown, 15 images]
[im 12/165  mediastinal]
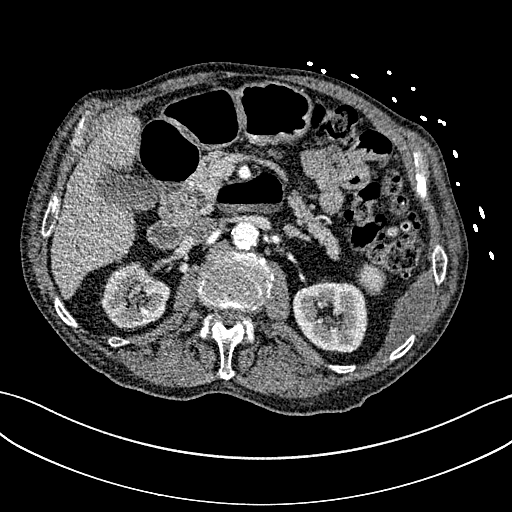
[im 12/165  lung]
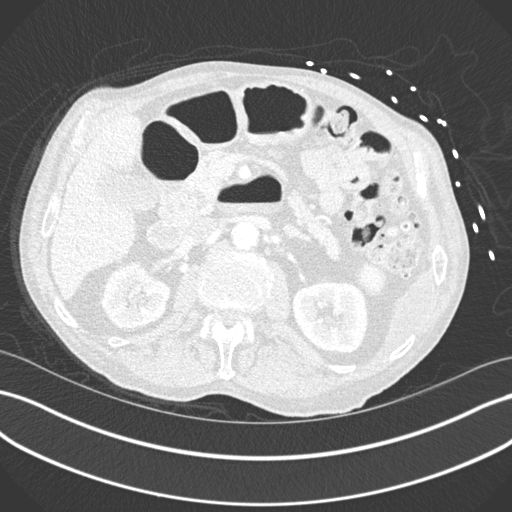
[im 24/165  lung]
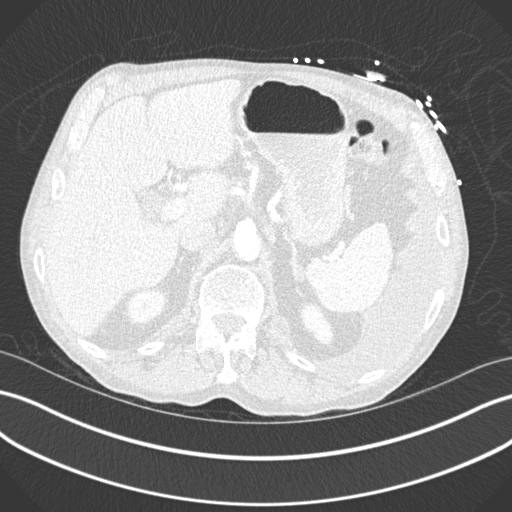
[im 36/165  lung]
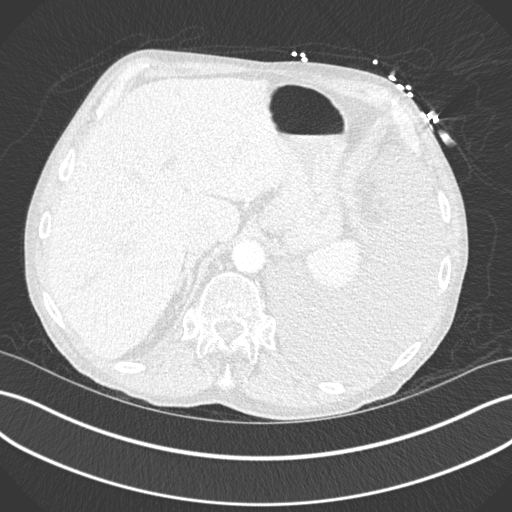
[im 47/165  lung]
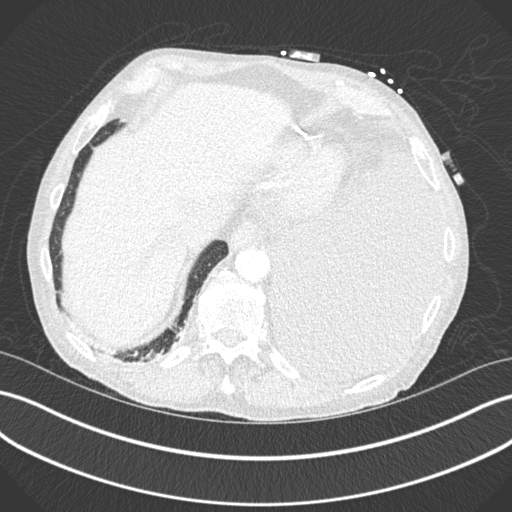
[im 59/165  mediastinal]
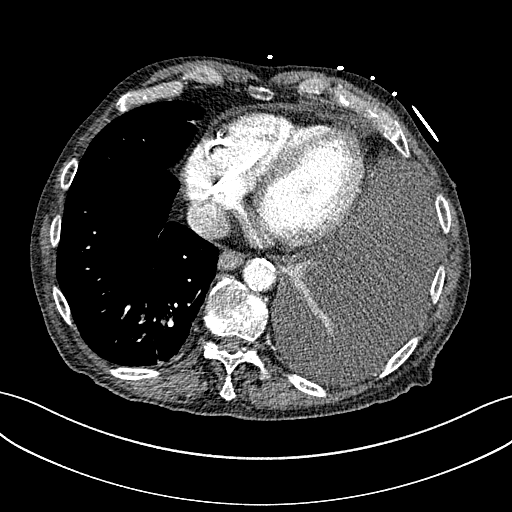
[im 59/165  lung]
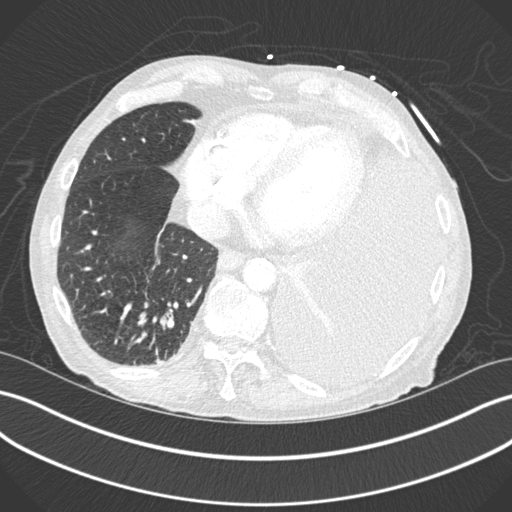
[im 71/165  lung]
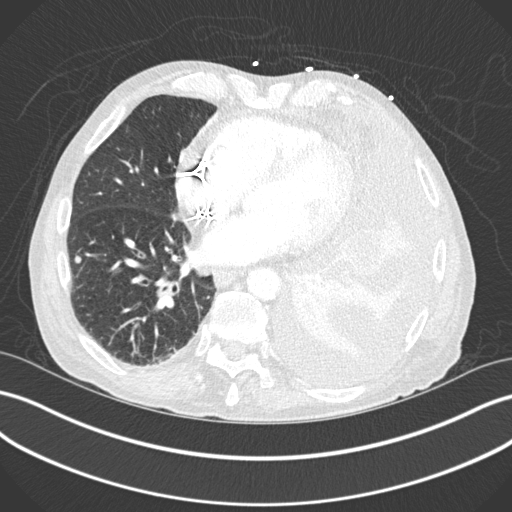
[im 94/165  lung]
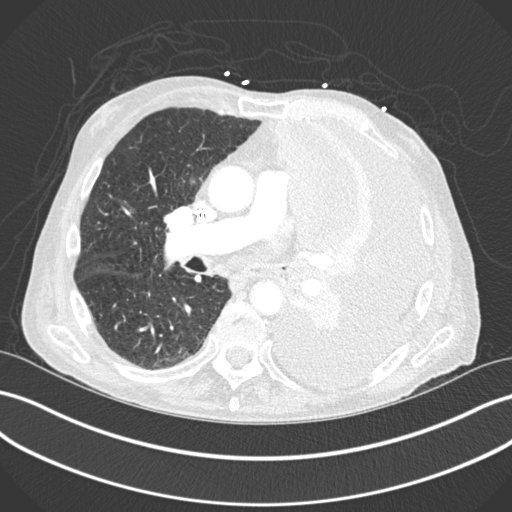
[im 106/165  lung]
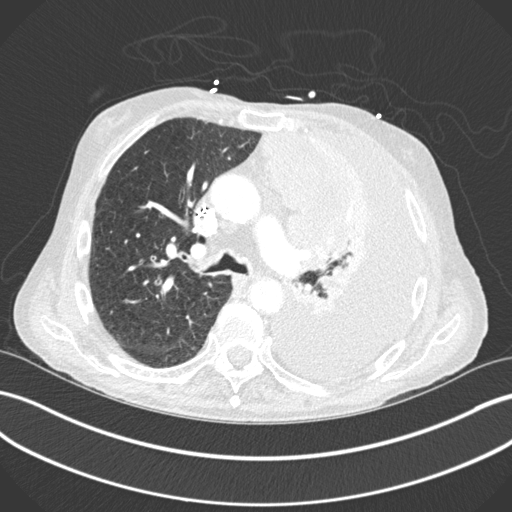
[im 118/165  mediastinal]
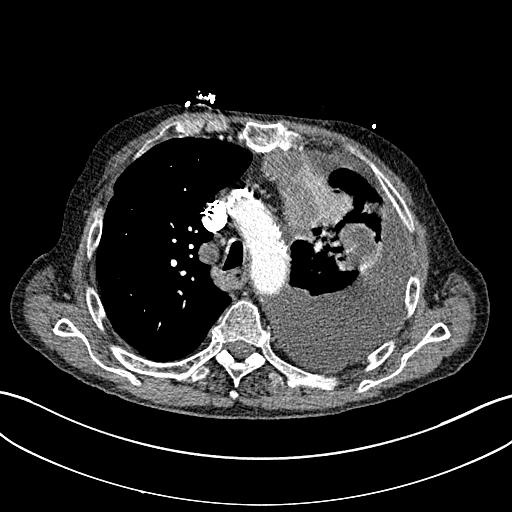
[im 118/165  lung]
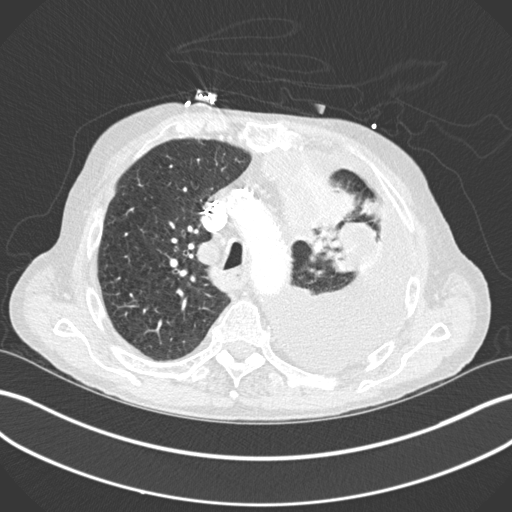
[im 129/165  lung]
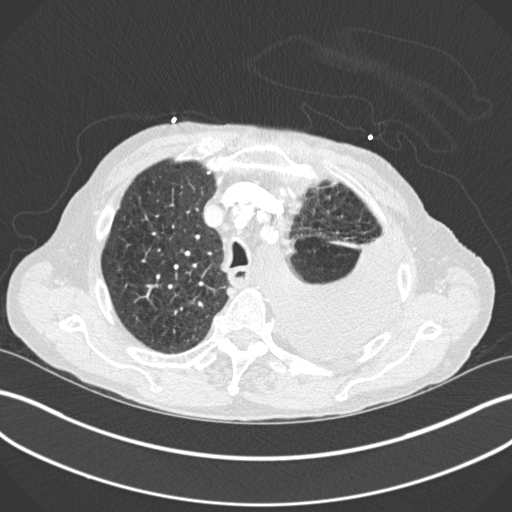
[im 141/165  lung]
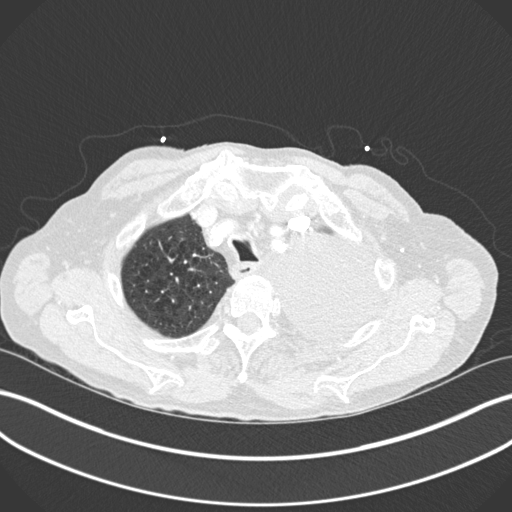
[im 153/165  lung]
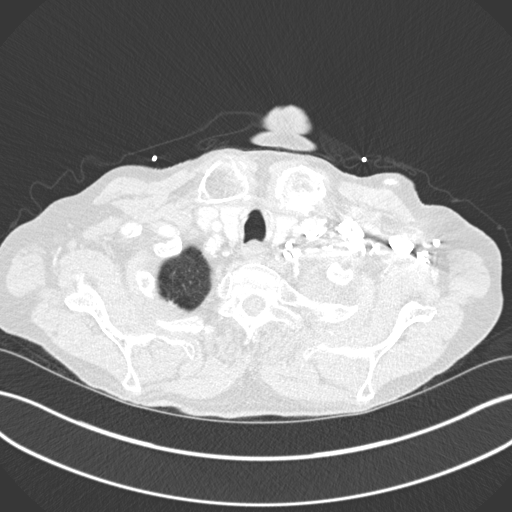

[Series 5: coronal · coronal · 0.73mm/px · 3 of 132 slices shown]
[im 27/132  lung]
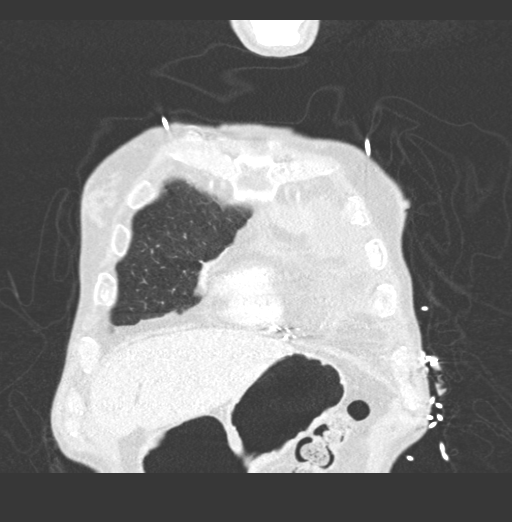
[im 53/132  lung]
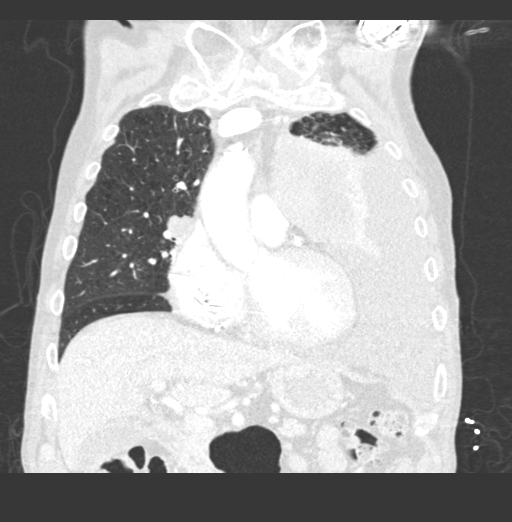
[im 79/132  lung]
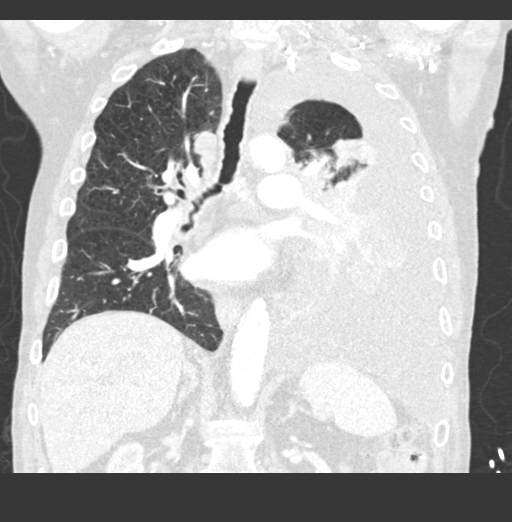

[15 of 36 positions shown; findings below may reference images not displayed]

FINDINGS: Cardiovascular: Atherosclerosis of thoracic aorta is noted without
aneurysm formation. Coronary artery calcifications are noted. Normal
cardiac size. No pericardial effusion.

Mediastinum/Nodes: Thyroid gland and esophagus are unremarkable. No
significant adenopathy is noted.

Lungs/Pleura: 10.8 x 10.3 x 5.0 cm probable mass is seen medially in
left upper lobe which is resulting in atelectasis of left upper
lobe. Several smaller masses are noted in the collapsed left lung
concerning for metastatic disease. The largest measures 4.1 x
cm. Soft tissue material is noted in the left mainstem bronchus and
its branches consistent with aspirated material or mucous plugging.
Large left pleural effusion is noted with atelectasis of the left
lower lobe and significant atelectasis of left upper lobe as
previously described. Multiple rounded densities are noted in the
right lung consistent with metastatic disease. The largest measures
2.4 cm in right middle lobe. Some degree of emphysematous disease is
noted in the right upper lobe.

Upper Abdomen: No acute abnormality.

Musculoskeletal: No chest wall abnormality. No acute or significant
osseous findings.
IMPRESSION: Large left pleural effusion is noted with complete atelectasis of
the left lower lobe and significant atelectasis of the left upper
lobe. 10.8 x 10.3 x 5 cm mass is noted medially in the left upper
lobe consistent with malignancy. Probable mucus plugging or
aspirated material is noted in the left mainstem bronchus. Multiple
pulmonary masses are noted in both lungs consistent with metastatic
disease. Further evaluation with PET scan and bronchoscopy is
recommended.

Coronary artery calcifications are noted suggesting coronary artery
disease.

Aortic Atherosclerosis (IE5PI-DEA.A) and Emphysema (IE5PI-6J1.X).

## 2018-08-27 IMAGING — US US THORACENTESIS ASP PLEURAL SPACE W/IMG GUIDE
1 series · 6 of 6 positions shown · non-contrast
Comparison: none

INDICATION: Patient with history of bladder cancer, COPD, left pleural effusion,
left lung mass. Request made for diagnostic and therapeutic left
thoracentesis.

[Series 1: us thoracentesis asp pleural space w/img guide · 6 of 6 slices shown]
[im 1/6]
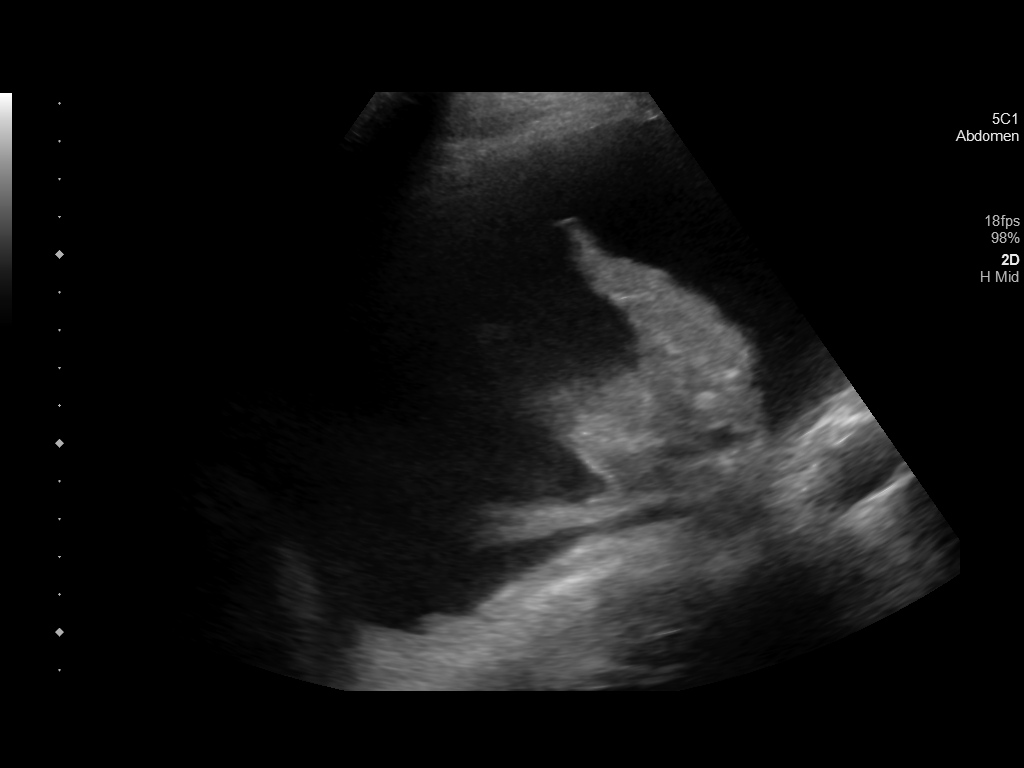
[im 2/6]
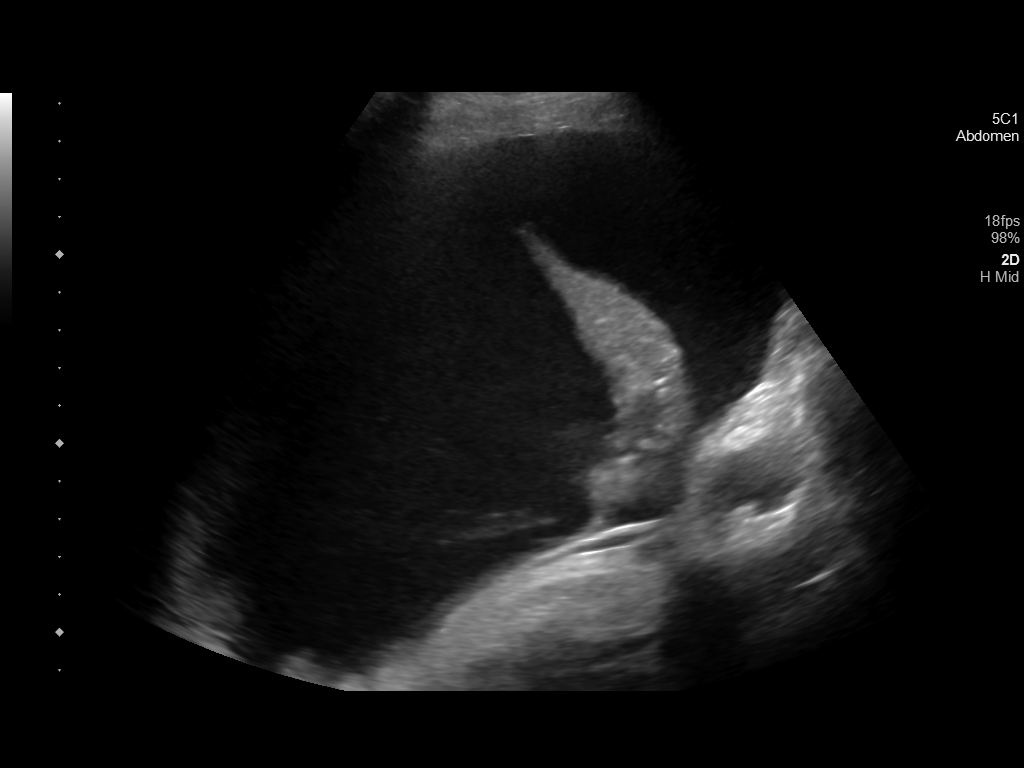
[im 3/6]
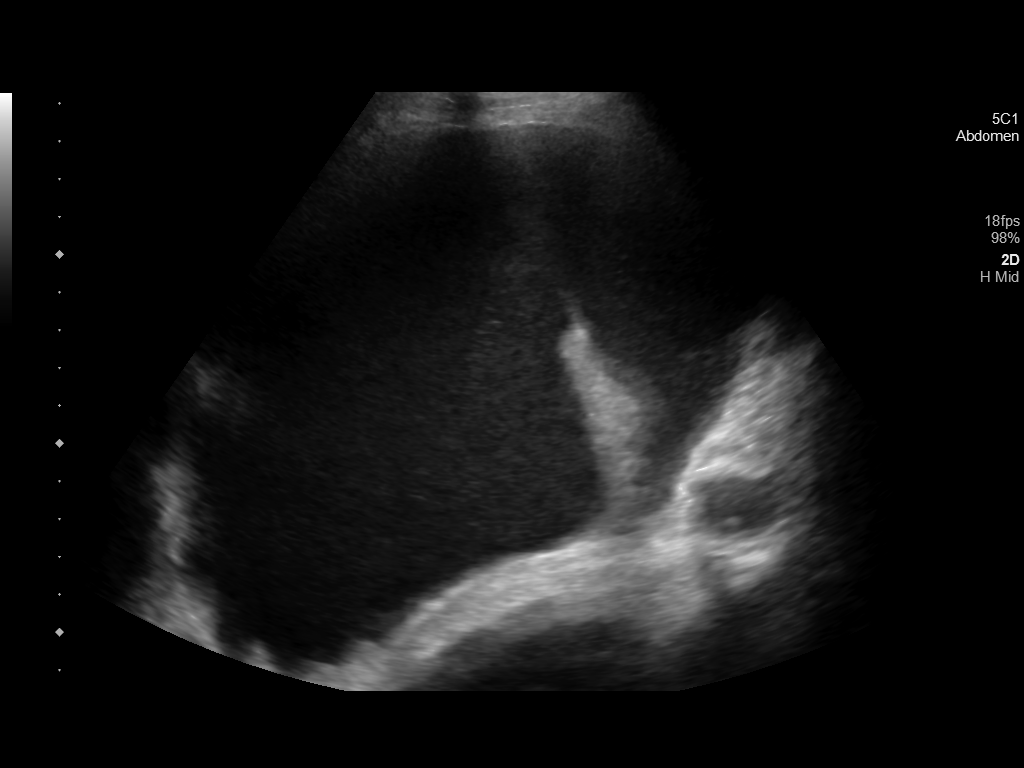
[im 4/6]
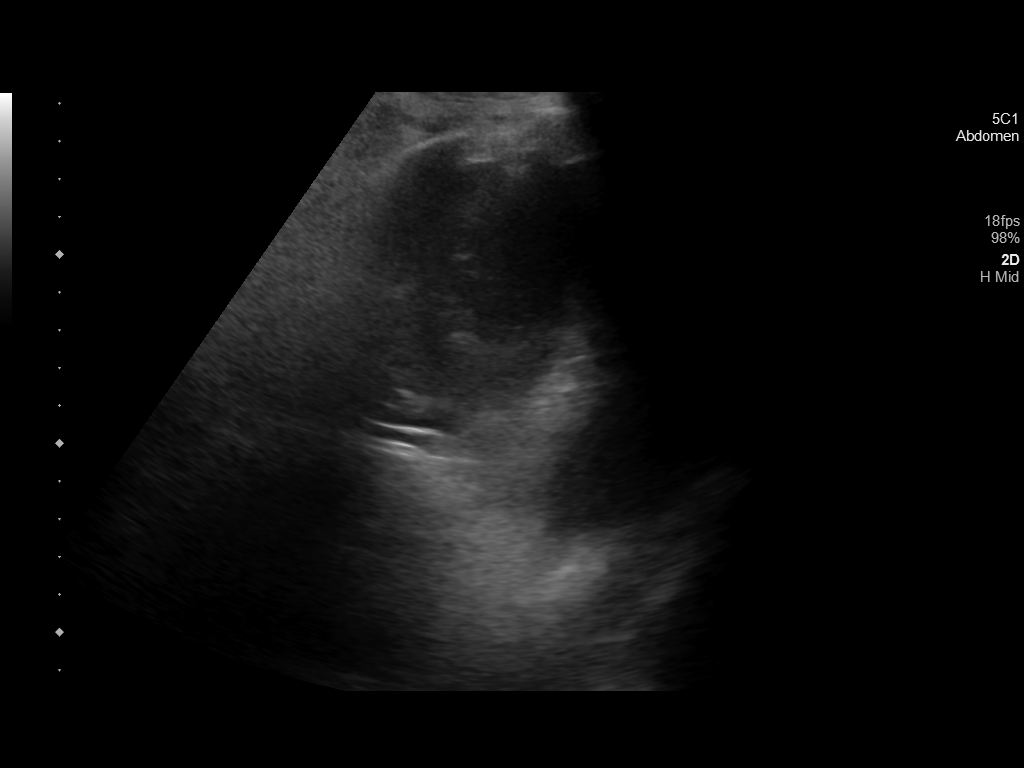
[im 5/6]
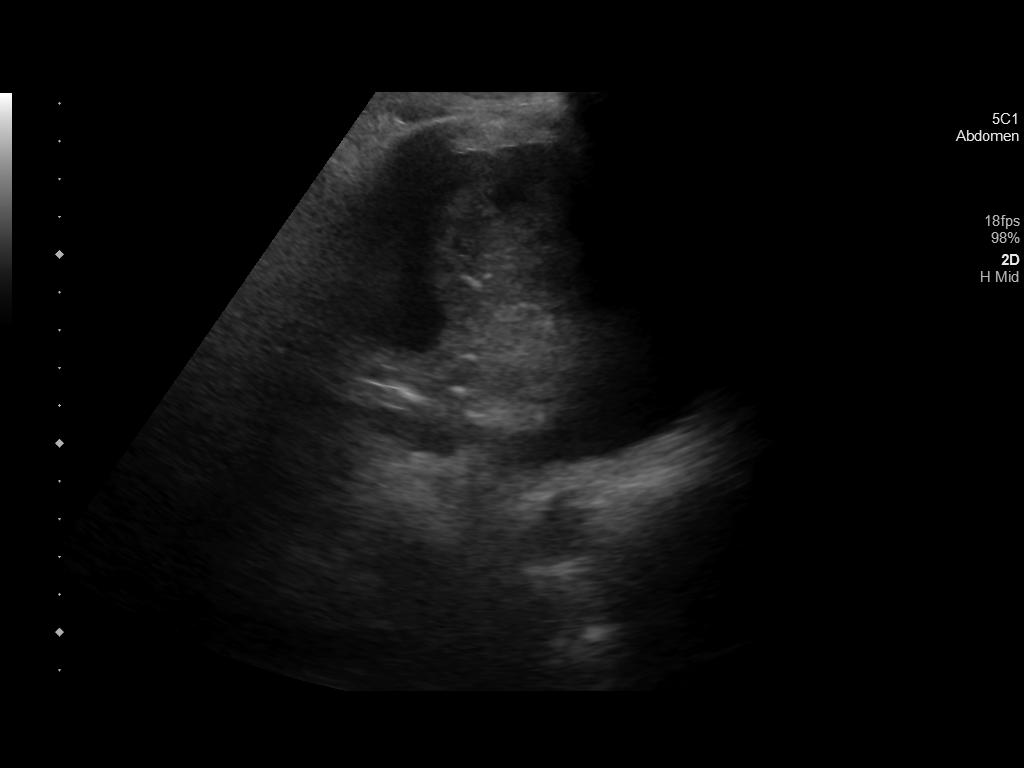
[im 6/6]
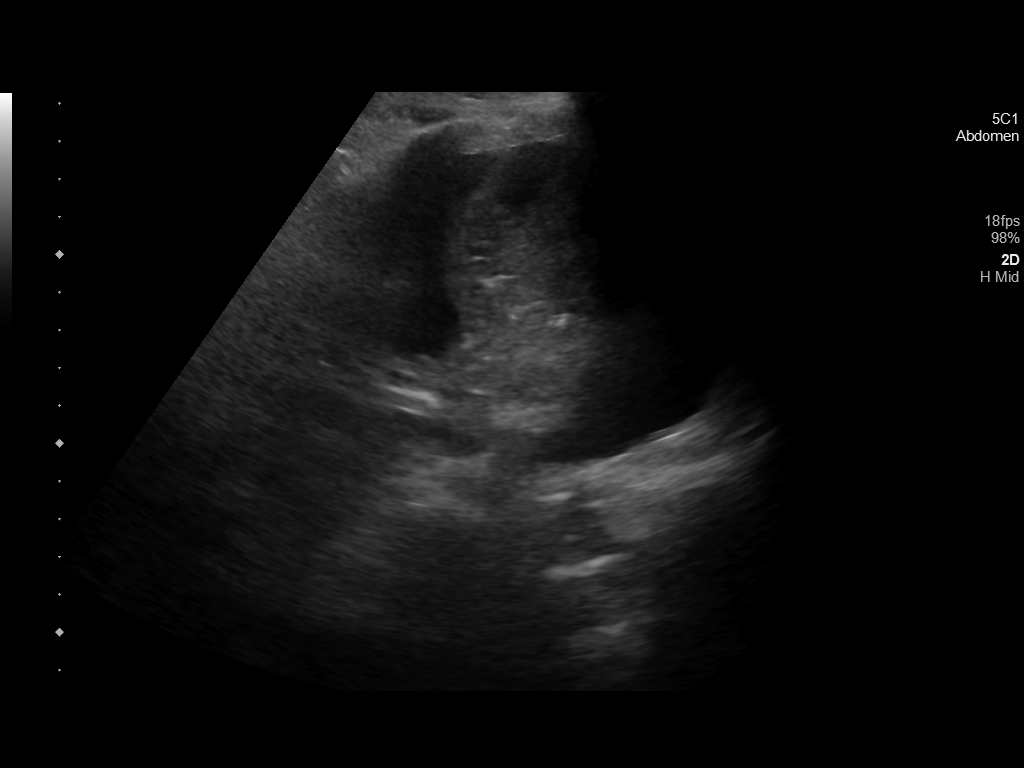

[6 of 6 positions shown; findings below may reference images not displayed]

EXAM:
ULTRASOUND GUIDED DIAGNOSTIC AND THERAPEUTIC LEFT THORACENTESIS

MEDICATIONS:
None

COMPLICATIONS:
None immediate.

PROCEDURE:
An ultrasound guided thoracentesis was thoroughly discussed with the
patient and questions answered. The benefits, risks, alternatives
and complications were also discussed. The patient understands and
wishes to proceed with the procedure. Written consent was obtained.

Ultrasound was performed to localize and mark an adequate pocket of
fluid in the left chest. The area was then prepped and draped in the
normal sterile fashion. 1% Lidocaine was used for local anesthesia.
Under ultrasound guidance a 6 Fr Safe-T-Centesis catheter was
introduced. Thoracentesis was performed. The catheter was removed
and a dressing applied.
FINDINGS: A total of approximately 2 liters of blood-tinged fluid was removed.
Samples were sent to the laboratory as requested by the clinical
team.
IMPRESSION: Successful ultrasound guided diagnostic and therapeutic left
thoracentesis yielding 2 liters of pleural fluid.

No pneumothorax on follow-up radiograph.
